# Patient Record
Sex: Female | Born: 1965 | Race: White | Hispanic: No | Marital: Married | Smoking: Never smoker
Health system: Southern US, Community
[De-identification: ages and names within clinical notes are randomized; demographics above are authoritative.]

## PROBLEM LIST (undated history)

## (undated) DIAGNOSIS — I10 Essential (primary) hypertension: Secondary | ICD-10-CM

## (undated) DIAGNOSIS — F329 Major depressive disorder, single episode, unspecified: Secondary | ICD-10-CM

## (undated) DIAGNOSIS — F32A Depression, unspecified: Secondary | ICD-10-CM

## (undated) DIAGNOSIS — T7840XA Allergy, unspecified, initial encounter: Secondary | ICD-10-CM

## (undated) DIAGNOSIS — I471 Supraventricular tachycardia, unspecified: Secondary | ICD-10-CM

## (undated) DIAGNOSIS — R011 Cardiac murmur, unspecified: Secondary | ICD-10-CM

## (undated) HISTORY — DX: Supraventricular tachycardia: I47.1

## (undated) HISTORY — DX: Essential (primary) hypertension: I10

## (undated) HISTORY — PX: GASTRECTOMY: SHX58

## (undated) HISTORY — DX: Supraventricular tachycardia, unspecified: I47.10

## (undated) HISTORY — DX: Allergy, unspecified, initial encounter: T78.40XA

## (undated) HISTORY — DX: Depression, unspecified: F32.A

## (undated) HISTORY — DX: Cardiac murmur, unspecified: R01.1

---
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
## 1998-07-24 ENCOUNTER — Ambulatory Visit (HOSPITAL_COMMUNITY): Admission: RE | Admit: 1998-07-24 | Discharge: 1998-07-24 | Payer: Self-pay | Admitting: *Deleted

## 1998-12-16 ENCOUNTER — Other Ambulatory Visit: Admission: RE | Admit: 1998-12-16 | Discharge: 1998-12-16 | Payer: Self-pay | Admitting: Obstetrics and Gynecology

## 1998-12-17 ENCOUNTER — Encounter: Payer: Self-pay | Admitting: Otolaryngology

## 1998-12-17 ENCOUNTER — Ambulatory Visit (HOSPITAL_COMMUNITY): Admission: RE | Admit: 1998-12-17 | Discharge: 1998-12-17 | Payer: Self-pay | Admitting: Otolaryngology

## 1999-10-03 ENCOUNTER — Encounter: Payer: Self-pay | Admitting: Obstetrics & Gynecology

## 1999-10-03 ENCOUNTER — Encounter: Admission: RE | Admit: 1999-10-03 | Discharge: 1999-10-03 | Payer: Self-pay | Admitting: Obstetrics & Gynecology

## 2000-03-03 ENCOUNTER — Inpatient Hospital Stay (HOSPITAL_COMMUNITY): Admission: AD | Admit: 2000-03-03 | Discharge: 2000-03-05 | Payer: Self-pay | Admitting: Obstetrics and Gynecology

## 2000-03-06 ENCOUNTER — Encounter: Admission: RE | Admit: 2000-03-06 | Discharge: 2000-06-04 | Payer: Self-pay | Admitting: Obstetrics and Gynecology

## 2000-04-07 ENCOUNTER — Other Ambulatory Visit: Admission: RE | Admit: 2000-04-07 | Discharge: 2000-04-07 | Payer: Self-pay | Admitting: Obstetrics and Gynecology

## 2000-06-04 ENCOUNTER — Encounter: Payer: Self-pay | Admitting: Infectious Diseases

## 2000-06-04 ENCOUNTER — Ambulatory Visit (HOSPITAL_COMMUNITY): Admission: RE | Admit: 2000-06-04 | Discharge: 2000-06-04 | Payer: Self-pay | Admitting: Family Medicine

## 2000-06-09 ENCOUNTER — Encounter: Admission: RE | Admit: 2000-06-09 | Discharge: 2000-09-07 | Payer: Self-pay | Admitting: Obstetrics and Gynecology

## 2001-01-26 ENCOUNTER — Encounter: Admission: RE | Admit: 2001-01-26 | Discharge: 2001-01-26 | Payer: Self-pay | Admitting: *Deleted

## 2001-04-12 ENCOUNTER — Other Ambulatory Visit: Admission: RE | Admit: 2001-04-12 | Discharge: 2001-04-12 | Payer: Self-pay | Admitting: Obstetrics and Gynecology

## 2002-01-26 ENCOUNTER — Encounter: Admission: RE | Admit: 2002-01-26 | Discharge: 2002-01-26 | Payer: Self-pay | Admitting: *Deleted

## 2002-05-09 ENCOUNTER — Other Ambulatory Visit: Admission: RE | Admit: 2002-05-09 | Discharge: 2002-05-09 | Payer: Self-pay | Admitting: Obstetrics and Gynecology

## 2002-07-31 ENCOUNTER — Ambulatory Visit (HOSPITAL_COMMUNITY): Admission: RE | Admit: 2002-07-31 | Discharge: 2002-07-31 | Payer: Self-pay | Admitting: Otolaryngology

## 2002-07-31 ENCOUNTER — Encounter: Payer: Self-pay | Admitting: Otolaryngology

## 2003-01-19 ENCOUNTER — Encounter: Admission: RE | Admit: 2003-01-19 | Discharge: 2003-01-19 | Payer: Self-pay | Admitting: *Deleted

## 2003-05-26 HISTORY — PX: NASAL SINUS SURGERY: SHX719

## 2003-06-13 ENCOUNTER — Other Ambulatory Visit: Admission: RE | Admit: 2003-06-13 | Discharge: 2003-06-13 | Payer: Self-pay | Admitting: Obstetrics and Gynecology

## 2003-07-15 ENCOUNTER — Emergency Department (HOSPITAL_COMMUNITY): Admission: EM | Admit: 2003-07-15 | Discharge: 2003-07-15 | Payer: Self-pay | Admitting: Emergency Medicine

## 2003-08-08 ENCOUNTER — Ambulatory Visit (HOSPITAL_COMMUNITY): Admission: RE | Admit: 2003-08-08 | Discharge: 2003-08-08 | Payer: Self-pay | Admitting: Neurology

## 2004-01-23 ENCOUNTER — Encounter: Admission: RE | Admit: 2004-01-23 | Discharge: 2004-01-23 | Payer: Self-pay | Admitting: General Surgery

## 2004-06-18 ENCOUNTER — Other Ambulatory Visit: Admission: RE | Admit: 2004-06-18 | Discharge: 2004-06-18 | Payer: Self-pay | Admitting: Obstetrics and Gynecology

## 2004-08-08 ENCOUNTER — Encounter: Admission: RE | Admit: 2004-08-08 | Discharge: 2004-08-08 | Payer: Self-pay | Admitting: Internal Medicine

## 2004-08-15 ENCOUNTER — Encounter: Admission: RE | Admit: 2004-08-15 | Discharge: 2004-08-15 | Payer: Self-pay | Admitting: Internal Medicine

## 2004-11-18 ENCOUNTER — Encounter: Admission: RE | Admit: 2004-11-18 | Discharge: 2004-11-18 | Payer: Self-pay | Admitting: Internal Medicine

## 2005-01-29 ENCOUNTER — Encounter: Admission: RE | Admit: 2005-01-29 | Discharge: 2005-01-29 | Payer: Self-pay | Admitting: Obstetrics and Gynecology

## 2005-02-05 ENCOUNTER — Encounter: Admission: RE | Admit: 2005-02-05 | Discharge: 2005-02-05 | Payer: Self-pay | Admitting: Obstetrics and Gynecology

## 2005-02-26 ENCOUNTER — Encounter: Admission: RE | Admit: 2005-02-26 | Discharge: 2005-02-26 | Payer: Self-pay | Admitting: Obstetrics and Gynecology

## 2005-05-22 ENCOUNTER — Encounter: Admission: RE | Admit: 2005-05-22 | Discharge: 2005-05-22 | Payer: Self-pay | Admitting: Internal Medicine

## 2005-06-23 ENCOUNTER — Other Ambulatory Visit: Admission: RE | Admit: 2005-06-23 | Discharge: 2005-06-23 | Payer: Self-pay | Admitting: Obstetrics and Gynecology

## 2006-01-12 ENCOUNTER — Encounter: Admission: RE | Admit: 2006-01-12 | Discharge: 2006-01-12 | Payer: Self-pay | Admitting: Obstetrics and Gynecology

## 2006-01-21 ENCOUNTER — Encounter: Admission: RE | Admit: 2006-01-21 | Discharge: 2006-01-21 | Payer: Self-pay | Admitting: Internal Medicine

## 2006-08-05 ENCOUNTER — Encounter: Admission: RE | Admit: 2006-08-05 | Discharge: 2006-08-05 | Payer: Self-pay | Admitting: Obstetrics and Gynecology

## 2007-01-19 ENCOUNTER — Encounter: Admission: RE | Admit: 2007-01-19 | Discharge: 2007-01-19 | Payer: Self-pay | Admitting: Obstetrics and Gynecology

## 2007-03-17 ENCOUNTER — Encounter: Admission: RE | Admit: 2007-03-17 | Discharge: 2007-03-17 | Payer: Self-pay | Admitting: General Surgery

## 2007-06-09 ENCOUNTER — Ambulatory Visit (HOSPITAL_BASED_OUTPATIENT_CLINIC_OR_DEPARTMENT_OTHER): Admission: RE | Admit: 2007-06-09 | Discharge: 2007-06-09 | Payer: Self-pay | Admitting: Orthopedic Surgery

## 2007-07-14 ENCOUNTER — Ambulatory Visit (HOSPITAL_BASED_OUTPATIENT_CLINIC_OR_DEPARTMENT_OTHER): Admission: RE | Admit: 2007-07-14 | Discharge: 2007-07-14 | Payer: Self-pay | Admitting: Orthopedic Surgery

## 2008-01-25 ENCOUNTER — Encounter: Admission: RE | Admit: 2008-01-25 | Discharge: 2008-01-25 | Payer: Self-pay | Admitting: General Surgery

## 2009-02-18 ENCOUNTER — Ambulatory Visit (HOSPITAL_COMMUNITY): Admission: RE | Admit: 2009-02-18 | Discharge: 2009-02-18 | Payer: Self-pay | Admitting: Obstetrics and Gynecology

## 2009-02-20 ENCOUNTER — Encounter: Admission: RE | Admit: 2009-02-20 | Discharge: 2009-02-20 | Payer: Self-pay | Admitting: General Surgery

## 2009-05-01 ENCOUNTER — Ambulatory Visit (HOSPITAL_COMMUNITY): Admission: RE | Admit: 2009-05-01 | Discharge: 2009-05-02 | Payer: Self-pay | Admitting: Obstetrics and Gynecology

## 2009-05-01 ENCOUNTER — Encounter (INDEPENDENT_AMBULATORY_CARE_PROVIDER_SITE_OTHER): Payer: Self-pay | Admitting: Obstetrics and Gynecology

## 2009-07-30 ENCOUNTER — Encounter: Admission: RE | Admit: 2009-07-30 | Discharge: 2009-09-26 | Payer: Self-pay | Admitting: Orthopedic Surgery

## 2010-02-27 ENCOUNTER — Encounter: Admission: RE | Admit: 2010-02-27 | Discharge: 2010-02-27 | Payer: Self-pay | Admitting: General Surgery

## 2010-04-24 HISTORY — PX: ABDOMINAL HYSTERECTOMY: SHX81

## 2010-05-08 ENCOUNTER — Ambulatory Visit: Payer: Self-pay | Admitting: Genetic Counselor

## 2010-08-26 LAB — CBC
MCHC: 33.2 g/dL (ref 30.0–36.0)
MCV: 92.6 fL (ref 78.0–100.0)
Platelets: 214 10*3/uL (ref 150–400)
Platelets: 263 10*3/uL (ref 150–400)
RDW: 13.2 % (ref 11.5–15.5)
WBC: 9.9 10*3/uL (ref 4.0–10.5)

## 2010-08-26 LAB — PREGNANCY, URINE: Preg Test, Ur: NEGATIVE

## 2010-10-07 NOTE — Op Note (Signed)
Rowe, Amber              ACCOUNT NO.:  [id number]   MEDICAL RECORD NO.:  [id number]          PATIENT TYPE:  AMB   LOCATION:  DSC                          FACILITY:  [facility]   PHYSICIAN:  Dionne Ano. Gramig III, M.D.DATE OF BIRTH:  05/27/1965   DATE OF PROCEDURE:  07/14/2007  DATE OF DISCHARGE:                               OPERATIVE REPORT   PREOPERATIVE DIAGNOSIS:  Left carpal tunnel syndrome.   POSTOPERATIVE DIAGNOSIS:  Left carpal tunnel syndrome.   PROCEDURE:  1. Left median nerve /peripheral nerve block at the wrist forearm      level for anesthetic purposes for carpal tunnel release.  2. Left limited open carpal tunnel release.   SURGEON:  Dionne Ano. Amanda Pea, M.D.   ASSISTANT:  Karie Chimera, P.A.-C.   COMPLICATIONS:  None.   ANESTHESIA:  Peripheral nerve block with IV sedation, keeping the  patient awake, alert, and oriented the entire case.   TOURNIQUET TIME:  Less than 10 minutes.   INDICATIONS FOR PROCEDURE:  This patient is a very pleasant female who  presents with the above-mentioned diagnosis.  She has had prior right  carpal tunnel release and did well from this.  She presents for left  carpal tunnel release.   OPERATION IN DETAIL:  The patient was seen by myself and anesthesia and  taken to the operative suite.  She underwent the induction of median  nerve/peripheral nerve block at the wrist forearm level for anesthetic  purposes for carpal tunnel release.  Following this, she was prepped and  draped in the usual sterile fashion with Betadine scrub and paint.  Once  this was done, the sterile field was secured, and the arm was elevated.  The tourniquet was insufflated to 250 mmHg.   A 1-cm incision was made through the transverse carpal ligament.  Dissection was carried down.  The palmar fascia was released.  Following  this, the distal edge was released under 4.0 loupe magnification,  carefully protecting the superficial palmar arch and the median  nerve.  Following this, distal and proximal dissection was carried out until I  was available __________  1, 2, and 3 which were placed just under the  proximal leading leaflet of the transverse carpal ligament.  Once this  was done, the security clip was placed just under proximal leading  leaflet, the obturator disengaged.  Following this, the security knife  was placed and a security clip effectively releasing the proximal  leaflet of the transverse carpal ligament.  The patient tolerated this  well.  There were no complicating features.  The carpal tunnel was then  inspected and was noted to be completely released without space-  occupying lesions or other problems noted.  I then irrigated copiously  with the tourniquet deflated, obtained hemostasis with bipolar  electrocautery, and I closed wound with interrupted Prolene.  She was  placed in a sterile dressing and taken to the recovery room.   All sponge and instrument counts were reported as correct.  She will be  discharged home on Vicodin and Robaxin p.r.n. which she already has for  pain and  spasm purposes.  She will return to see me in 7 days, therapy  in 12 days, and notify me should any problems occur.  This was an  uncomplicated carpal tunnel release, and she did very well with her  surgical intervention today.  It was a pleasure to see her, and we look  forward to participating in her care.           ______________________________  Dionne Ano. Everlene Other, M.D.     Nash Mantis  D:  07/14/2007  T:  07/15/2007  Job:  [id number]

## 2010-10-07 NOTE — Op Note (Signed)
Amber Rowe, Amber Rowe              ACCOUNT NO.:  [id number]   MEDICAL RECORD NO.:  [id number]          PATIENT TYPE:  AMB   LOCATION:  DSC                          FACILITY:  [facility]   PHYSICIAN:  Dionne Ano. Gramig III, M.D.DATE OF BIRTH:  1965/08/25   DATE OF PROCEDURE:  06/09/2007  DATE OF DISCHARGE:                               OPERATIVE REPORT   PREOPERATIVE DIAGNOSIS:  Bilateral carpal tunnel syndrome.   POSTOPERATIVE DIAGNOSIS:  Bilateral carpal tunnel syndrome.   PROCEDURE:  1. Right median nerve/peripheral nerve block.  2. Right carpal tunnel release.  3. Left carpal tunnel injection.   SURGEON:  Dionne Ano. Amanda Pea, M.D.   ASSISTANT:  Karie Chimera, P.A.-C.   COMPLICATIONS:  None.   ANESTHESIA:  Peripheral nerve block with IV sedation keeping the patient  awake, alert and oriented the entire case.   TOURNIQUET TIME:  Less than 15 minutes.   INDICATIONS FOR PROCEDURE:  Amber Rowe is a very pleasant female.  She  works as a Marine scientist at [employer] and has had long  standing carpal tunnel syndrome.  I have counseled her in regards to the  risks and benefits of surgery and should note she has failed  conservative management.  We have discussed the risks of bleeding,  infection, anesthesia, damage to normal structures, and failure of the  surgery to accomplish its intended goals of relieving symptoms and  restoring function. With this in mind, she desires to proceed.  All  questions have been encouraged answered preoperatively.   OPERATIVE PROCEDURE:  The patient was seen by myself and anesthesia and  taken to the operating suite and underwent 2 mg of Versed and 1 of  fentanyl and was given a median nerve/peripheral nerve block in the  normal fashion for carpal tunnel release.  Following this, the patient  then was prepped and draped in the usual sterile fashion with Betadine  scrub and paint.  Once this was complete, the arm was elevated and the  tourniquet insufflated to 250 mmHg and the patient then underwent a 1 cm  incision at the transverse carpal ligament.  Dissection was carried  down, the palmar fascia incised.  The distal edge of the transverse  carpal ligament was released under 4.0 magnification without difficulty.  The superficial palmar arch was protected at all.  The patient then had  distal to proximal dissection carried out was available preparatory  device 1, 2, and 3, which was placed just under the proximal leading  leaflet of the transverse carpal ligament.  Following this, the security  clip was placed, the obturator disengaged, and the security knife was  placed and the security clip effectively releasing the proximal leaflet  of the transverse carpal ligament.  I was pleased with the findings.  I  inspected the canal and noted that the nerve was hyperemic, flattened,  and intact.  She was awake, alert and oriented during all parts of the  procedure and tolerated the procedure beautifully.  I irrigated  copiously, obtained hemostasis with bipolar cautery, and closed the  wound with Prolene.  Following this, the  patient was dressed sterilely.  Once this was done, the left wrist was addressed with alcohol  preparation followed by sterile prep and placement of a carpal tunnel  injection utilizing 1 mL of Celestone and 1 mL of lidocaine. She  tolerated this well.  There were no complicating features.  Once this  was done, the patient then underwent placement of a Band-Aid and was  taken to the recovery room.  She will be monitored in the recovery room  and discharged home once appropriate. I have discussed with her the  do's, the don't's, etc., and all questions have been encouraged and  answered.  It was an absolute pleasure to see her today and we look  forward to participating in her postoperative care.           ______________________________  Dionne Ano. Everlene Other, M.D.     Nash Mantis  D:  06/09/2007  T:   06/09/2007  Job:  [id number]

## 2011-01-19 ENCOUNTER — Other Ambulatory Visit (INDEPENDENT_AMBULATORY_CARE_PROVIDER_SITE_OTHER): Payer: Self-pay | Admitting: General Surgery

## 2011-01-19 DIAGNOSIS — Z1231 Encounter for screening mammogram for malignant neoplasm of breast: Secondary | ICD-10-CM

## 2011-01-30 ENCOUNTER — Other Ambulatory Visit (INDEPENDENT_AMBULATORY_CARE_PROVIDER_SITE_OTHER): Payer: Self-pay | Admitting: General Surgery

## 2011-01-30 DIAGNOSIS — N6012 Diffuse cystic mastopathy of left breast: Secondary | ICD-10-CM

## 2011-02-12 LAB — HCG, SERUM, QUALITATIVE: Preg, Serum: NEGATIVE

## 2011-03-03 ENCOUNTER — Other Ambulatory Visit (INDEPENDENT_AMBULATORY_CARE_PROVIDER_SITE_OTHER): Payer: Self-pay | Admitting: General Surgery

## 2011-03-03 ENCOUNTER — Ambulatory Visit
Admission: RE | Admit: 2011-03-03 | Discharge: 2011-03-03 | Disposition: A | Discharge status: Home / Self Care | Payer: 59 | Source: Ambulatory Visit | Attending: General Surgery | Admitting: General Surgery

## 2011-03-03 DIAGNOSIS — Z1231 Encounter for screening mammogram for malignant neoplasm of breast: Secondary | ICD-10-CM

## 2011-03-03 DIAGNOSIS — N644 Mastodynia: Secondary | ICD-10-CM

## 2011-03-20 ENCOUNTER — Other Ambulatory Visit (INDEPENDENT_AMBULATORY_CARE_PROVIDER_SITE_OTHER): Payer: Self-pay | Admitting: General Surgery

## 2011-03-20 ENCOUNTER — Ambulatory Visit
Admission: RE | Admit: 2011-03-20 | Discharge: 2011-03-20 | Disposition: A | Discharge status: Home / Self Care | Payer: 59 | Source: Ambulatory Visit | Attending: General Surgery | Admitting: General Surgery

## 2011-03-20 DIAGNOSIS — N644 Mastodynia: Secondary | ICD-10-CM

## 2011-03-31 ENCOUNTER — Other Ambulatory Visit: Payer: 59

## 2011-04-14 ENCOUNTER — Telehealth (INDEPENDENT_AMBULATORY_CARE_PROVIDER_SITE_OTHER): Payer: Self-pay

## 2011-04-14 NOTE — Telephone Encounter (Signed)
Called Ms. Tholl, no answer left voice message to return my call.  I will be out of the office and return on 04/20/11.

## 2011-04-21 ENCOUNTER — Telehealth (INDEPENDENT_AMBULATORY_CARE_PROVIDER_SITE_OTHER): Payer: Self-pay

## 2011-04-21 NOTE — Telephone Encounter (Signed)
Patient was previous scheduled for an MRI, insurance denied prior approval.  Ms. Oakland called to give [facility] Radiologist MD phone number to call, they are requesting more information to support the need for an MRI.  [phone] prompt [phone])  I told Ms. Duris I will call [employer] to find out the additional supporting documents they need for prior approval.  I will call patient once I have obtained a final answer on approval.

## 2011-05-06 ENCOUNTER — Other Ambulatory Visit (INDEPENDENT_AMBULATORY_CARE_PROVIDER_SITE_OTHER): Payer: Self-pay

## 2011-05-06 ENCOUNTER — Telehealth (INDEPENDENT_AMBULATORY_CARE_PROVIDER_SITE_OTHER): Payer: Self-pay

## 2011-05-06 NOTE — Telephone Encounter (Signed)
Left message for Amber Rowe to call our office regarding her MRI ---- [employer] approved through 06/19/2011  CPT code 40981   Authorization approval code [id number].

## 2011-05-22 ENCOUNTER — Ambulatory Visit
Admission: RE | Admit: 2011-05-22 | Discharge: 2011-05-22 | Disposition: A | Discharge status: Home / Self Care | Payer: 59 | Source: Ambulatory Visit | Attending: General Surgery | Admitting: General Surgery

## 2011-05-22 DIAGNOSIS — N6012 Diffuse cystic mastopathy of left breast: Secondary | ICD-10-CM

## 2011-05-22 MED ORDER — GADOBENATE DIMEGLUMINE 529 MG/ML IV SOLN
15.0000 mL | Freq: Once | INTRAVENOUS | Status: AC | PRN
  Administered 2011-05-22: 15 mL via INTRAVENOUS

## 2011-06-16 ENCOUNTER — Telehealth (INDEPENDENT_AMBULATORY_CARE_PROVIDER_SITE_OTHER): Payer: Self-pay

## 2011-06-16 NOTE — Telephone Encounter (Signed)
Returned patient phone call regarding her MRI results, I will review with Dr. Johna Sheriff and schedule follow up appt accordingly.  Left message stating I will call on 06/17/11 w/results and appointment information.

## 2011-06-18 ENCOUNTER — Telehealth (INDEPENDENT_AMBULATORY_CARE_PROVIDER_SITE_OTHER): Payer: Self-pay

## 2011-06-18 NOTE — Telephone Encounter (Signed)
Called MRI results to patient, Amber Rowe has a follow up appointment on 07/15/11 w/Dr. Johna Sheriff.

## 2011-07-15 ENCOUNTER — Ambulatory Visit (INDEPENDENT_AMBULATORY_CARE_PROVIDER_SITE_OTHER): Payer: 59 | Admitting: General Surgery

## 2011-07-15 ENCOUNTER — Encounter (INDEPENDENT_AMBULATORY_CARE_PROVIDER_SITE_OTHER): Payer: Self-pay | Admitting: General Surgery

## 2011-07-15 DIAGNOSIS — Z803 Family history of malignant neoplasm of breast: Secondary | ICD-10-CM | POA: Insufficient documentation

## 2011-07-15 DIAGNOSIS — N6019 Diffuse cystic mastopathy of unspecified breast: Secondary | ICD-10-CM

## 2011-07-15 NOTE — Progress Notes (Signed)
Chief complaint: Followup fibrocystic disease and family history of breast cancer.  History: Patient returns for yearly followup for breast disease with fibrocystic breast changes and strong family history of breast cancer in her mother, sister, and aunt. We had referred her to genetics at her last visit but her mother has tested negative and therefore genetic testing was not recommended. She has not noted any specific changes in her breast exam. She recently completed a mammography and a semiannual MRI.  Exam: Gen.: Appears well Skin: No rash or infection Lymph nodes: No palpable cervical, supraclavicular, or axillary nodes. Breasts: No skin or nipple changes. I cannot feel any abnormalities in the right breast. In the left breast in the upper outer quadrant is a stable approximately 1-1/2-2 cm area of thickening which is unchanged from previous exams.  Mammogram showed a benign appearing density in the lateral right breast confirmed to be a cluster of cysts. MRI showed background density but no suspicious abnormalities.  Assessment and plan: Personal history of fibrocystic disease and strong family history of breast cancer. Her imaging and exam is stable without new areas of concern. She will return in one year.

## 2011-08-07 ENCOUNTER — Ambulatory Visit (INDEPENDENT_AMBULATORY_CARE_PROVIDER_SITE_OTHER): Payer: 59 | Admitting: General Surgery

## 2012-01-19 ENCOUNTER — Other Ambulatory Visit (INDEPENDENT_AMBULATORY_CARE_PROVIDER_SITE_OTHER): Payer: Self-pay | Admitting: General Surgery

## 2012-01-19 DIAGNOSIS — Z1231 Encounter for screening mammogram for malignant neoplasm of breast: Secondary | ICD-10-CM

## 2012-01-19 DIAGNOSIS — Z803 Family history of malignant neoplasm of breast: Secondary | ICD-10-CM

## 2012-03-22 ENCOUNTER — Ambulatory Visit: Payer: 59

## 2012-04-04 ENCOUNTER — Ambulatory Visit
Admission: RE | Admit: 2012-04-04 | Discharge: 2012-04-04 | Disposition: A | Discharge status: Home / Self Care | Payer: 59 | Source: Ambulatory Visit | Attending: General Surgery | Admitting: General Surgery

## 2012-04-04 DIAGNOSIS — Z803 Family history of malignant neoplasm of breast: Secondary | ICD-10-CM

## 2012-04-04 DIAGNOSIS — Z1231 Encounter for screening mammogram for malignant neoplasm of breast: Secondary | ICD-10-CM

## 2012-04-07 ENCOUNTER — Other Ambulatory Visit (INDEPENDENT_AMBULATORY_CARE_PROVIDER_SITE_OTHER): Payer: Self-pay | Admitting: General Surgery

## 2012-04-07 DIAGNOSIS — R928 Other abnormal and inconclusive findings on diagnostic imaging of breast: Secondary | ICD-10-CM

## 2012-04-12 ENCOUNTER — Ambulatory Visit
Admission: RE | Admit: 2012-04-12 | Discharge: 2012-04-12 | Disposition: A | Discharge status: Home / Self Care | Payer: 59 | Source: Ambulatory Visit | Attending: General Surgery | Admitting: General Surgery

## 2012-04-12 DIAGNOSIS — R928 Other abnormal and inconclusive findings on diagnostic imaging of breast: Secondary | ICD-10-CM

## 2012-04-14 ENCOUNTER — Other Ambulatory Visit: Payer: 59

## 2012-04-15 ENCOUNTER — Other Ambulatory Visit: Payer: 59

## 2012-04-15 ENCOUNTER — Telehealth (INDEPENDENT_AMBULATORY_CARE_PROVIDER_SITE_OTHER): Payer: Self-pay

## 2012-04-15 NOTE — Telephone Encounter (Signed)
Diagnostic Study form signed by Dr. Johna Sheriff & faxed to [facility] [phone].

## 2012-08-11 ENCOUNTER — Ambulatory Visit (INDEPENDENT_AMBULATORY_CARE_PROVIDER_SITE_OTHER): Payer: 59 | Admitting: General Surgery

## 2012-08-11 ENCOUNTER — Encounter (INDEPENDENT_AMBULATORY_CARE_PROVIDER_SITE_OTHER): Payer: Self-pay | Admitting: General Surgery

## 2012-08-11 VITALS — BP 128/84 | HR 84 | Temp 98.6°F | Resp 18 | Ht 66.5 in | Wt 195.0 lb

## 2012-08-11 DIAGNOSIS — N6019 Diffuse cystic mastopathy of unspecified breast: Secondary | ICD-10-CM

## 2012-08-11 DIAGNOSIS — Z803 Family history of malignant neoplasm of breast: Secondary | ICD-10-CM

## 2012-08-11 NOTE — Progress Notes (Signed)
Chief complaint: Followup fibrocystic disease and family history of breast cancer.   History: Patient returns for yearly followup for breast disease with fibrocystic breast changes and strong family history of breast cancer in her mother, sister, and aunt. We had previously referred her to genetics  but her mother has tested negative and therefore genetic testing was not recommended. She has not noted any specific changes in her breast exam. She had her mammogram done in November. This suggested a mass in the upper-outer quadrant of the left breast which has been noted previously. Ultrasound showed a small cluster of simple cysts in this area which is a stable finding from previous exams.  Exam:   Gen.: Appears well  Skin: No rash or infection  Lymph nodes: No palpable cervical, supraclavicular, or axillary nodes.  Breasts: No skin or nipple changes. I cannot feel any abnormalities in the right breast. In the left breast in the upper outer quadrant is a stable approximately 1-1/2-2 cm area of thickening which is unchanged from previous exams.   Assessment and plan: Personal history of fibrocystic disease and strong family history of breast cancer. Her imaging and exam is stable without new areas of concern. She will return in one year.

## 2012-08-11 NOTE — Addendum Note (Signed)
Addended by: Maryan Puls on: 08/11/2012 05:35 PM   Modules accepted: Orders

## 2012-09-14 ENCOUNTER — Encounter: Payer: Self-pay | Admitting: Cardiovascular Disease

## 2012-11-25 ENCOUNTER — Emergency Department (HOSPITAL_COMMUNITY)
Admission: EM | Admit: 2012-11-25 | Discharge: 2012-11-25 | Disposition: A | Discharge status: Home / Self Care | Payer: 59 | Source: Home / Self Care | Attending: Family Medicine | Admitting: Family Medicine

## 2012-11-25 ENCOUNTER — Encounter (HOSPITAL_COMMUNITY): Payer: Self-pay | Admitting: Emergency Medicine

## 2012-11-25 ENCOUNTER — Emergency Department (INDEPENDENT_AMBULATORY_CARE_PROVIDER_SITE_OTHER): Payer: 59

## 2012-11-25 DIAGNOSIS — K2970 Gastritis, unspecified, without bleeding: Secondary | ICD-10-CM

## 2012-11-25 DIAGNOSIS — K2990 Gastroduodenitis, unspecified, without bleeding: Secondary | ICD-10-CM

## 2012-11-25 LAB — COMPREHENSIVE METABOLIC PANEL
BUN: 12 mg/dL (ref 6–23)
CO2: 26 mEq/L (ref 19–32)
Chloride: 103 mEq/L (ref 96–112)
Creatinine, Ser: 0.8 mg/dL (ref 0.50–1.10)
GFR calc Af Amer: >90 mL/min (ref >90)
GFR calc non Af Amer: 88 mL/min — ABNORMAL LOW (ref >90)
Total Bilirubin: 0.4 mg/dL (ref 0.3–1.2)

## 2012-11-25 MED ORDER — ACETAMINOPHEN-CODEINE 120-12 MG/5ML PO SOLN: 5.0000 mL | Freq: Four times a day (QID) | ORAL | Status: DC | PRN

## 2012-11-25 MED ORDER — ONDANSETRON 4 MG PO TBDP: 4.0000 mg | ORAL_TABLET | Freq: Two times a day (BID) | ORAL | Status: DC | PRN

## 2012-11-25 MED ORDER — RANITIDINE HCL 150 MG PO CAPS: 150.0000 mg | ORAL_CAPSULE | Freq: Every day | ORAL | Status: DC

## 2012-11-25 MED ORDER — OMEPRAZOLE 20 MG PO CPDR: DELAYED_RELEASE_CAPSULE | ORAL | Status: DC

## 2012-11-25 MED ORDER — SUCRALFATE 1 GM/10ML PO SUSP: 1.0000 g | Freq: Three times a day (TID) | ORAL | Status: DC

## 2012-11-25 MED ORDER — GI COCKTAIL ~~LOC~~
30.0000 mL | Freq: Once | ORAL | Status: AC
  Administered 2012-11-25: 30 mL via ORAL

## 2012-11-25 MED ORDER — GI COCKTAIL ~~LOC~~
ORAL | Status: AC
  Filled 2012-11-25: qty 30

## 2012-11-25 NOTE — ED Notes (Signed)
Chart review.

## 2012-11-25 NOTE — ED Provider Notes (Signed)
History    CSN: [id number] Arrival date & time 11/25/12  1007  First MD Initiated Contact with Patient 11/25/12 1120     Chief Complaint  Patient presents with  . Chest Pain   (Consider location/radiation/quality/duration/timing/severity/associated sxs/prior Treatment) HPI Comments: 47 year old female here complaining of epigastric pain with radiation upward to the center and left side of her chest intermittently for the last 4 days. Patient stated she had a high school reunion where she consumed alcohol above what is usual for her before her symptoms started. Patient denies being a heavy drinker or drinking alcohol regularly. States she drinks alcohol only occasional. She reports burning sensation from the epigastric area upward but denies acid taste in her mouth. Reports frequent belching and feeling distended and a notch like sensation in her epigastric area.. State her pain is worse with taking deep breath. Denies nausea or vomiting. Pain is also triggered short time after eating. Denies diaphoresis. Denies shortness of breath or cough. States she had few episodes of loose stools the beginning of the week but is having normal bowel movements in last 2 days with no melena. No jaundice.   Past Medical History  Diagnosis Date  . Asthma   . Heart murmur    Past Surgical History  Procedure Laterality Date  . Nasal sinus surgery  2005  . Abdominal hysterectomy  04/2010   Family History  Problem Relation Age of Onset  . Cancer Mother     breast  . Cancer Sister     ovarian  . Cancer Maternal Grandmother     skin   History  Substance Use Topics  . Smoking status: Never Smoker   . Smokeless tobacco: Not on file  . Alcohol Use: Yes   OB History   Grav Para Term Preterm Abortions TAB SAB Ect Mult Living                 Review of Systems  Constitutional: Negative for fever, chills, diaphoresis and fatigue.  HENT: Negative for congestion and sore throat.   Respiratory: Negative  for cough and shortness of breath.   Cardiovascular: Positive for chest pain. Negative for palpitations and leg swelling.  Gastrointestinal: Positive for abdominal pain, diarrhea and abdominal distention. Negative for vomiting, constipation and blood in stool.  Genitourinary: Negative for dysuria, vaginal bleeding, vaginal discharge and pelvic pain.  Musculoskeletal: Negative for myalgias and arthralgias.  Skin: Negative for rash.  Neurological: Negative for dizziness and headaches.    Allergies  Latex  Home Medications   Current Outpatient Rx  Name  Route  Sig  Dispense  Refill  . acetaminophen-codeine 120-12 MG/5ML solution   Oral   Take 5 mLs by mouth every 6 (six) hours as needed for pain.   120 mL   0   . albuterol (PROVENTIL HFA;VENTOLIN HFA) 108 (90 BASE) MCG/ACT inhaler   Inhalation   Inhale 2 puffs into the lungs every 6 (six) hours as needed for wheezing.         Marland Kitchen buPROPion (WELLBUTRIN XL) 150 MG 24 hr tablet      daily.         . busPIRone (BUSPAR) 5 MG tablet      daily.         Marland Kitchen CALCIUM PO   Oral   Take 1,000 Units by mouth daily.         . citalopram (CELEXA) 10 MG tablet   Oral   Take 10 mg by mouth daily.         Marland Kitchen  hydrocortisone 2.5 % cream      Ad lib.         Marland Kitchen lisinopril (PRINIVIL,ZESTRIL) 10 MG tablet               . Multiple Vitamin (MULTIVITAMIN) capsule   Oral   Take 1 capsule by mouth daily.         Marland Kitchen omeprazole (PRILOSEC) 20 MG capsule      1 tab by mouth daily when necessary   30 capsule   0   . ondansetron (ZOFRAN-ODT) 4 MG disintegrating tablet   Oral   Take 1 tablet (4 mg total) by mouth every 12 (twelve) hours as needed for nausea.   10 tablet   0   . QVAR 40 MCG/ACT inhaler      daily.         . ranitidine (ZANTAC) 150 MG capsule   Oral   Take 1 capsule (150 mg total) by mouth daily.   30 capsule   0   . sucralfate (CARAFATE) 1 GM/10ML suspension   Oral   Take 10 mLs (1 g total) by mouth  4 (four) times daily -  with meals and at bedtime.   420 mL   0   . VITAMIN E PO   Oral   Take 1,000 Units by mouth.          BP 120/83  Pulse 89  Temp(Src) 98.1 F (36.7 C) (Oral)  Resp 16  SpO2 100% Physical Exam  Nursing note and vitals reviewed. Constitutional: She is oriented to person, place, and time. She appears well-developed and well-nourished. No distress.  Cooperative appears comfortable.  HENT:  Head: Normocephalic and atraumatic.  Mouth/Throat: Oropharynx is clear and moist. No oropharyngeal exudate.  Eyes: Conjunctivae are normal. No scleral icterus.  Neck: Neck supple. No JVD present. No thyromegaly present.  Cardiovascular: Normal rate, regular rhythm, normal heart sounds and intact distal pulses.  Exam reveals no gallop and no friction rub.   No murmur heard. Pulmonary/Chest: Effort normal and breath sounds normal. No respiratory distress. She has no wheezes. She has no rales. She exhibits no tenderness.  Abdominal: Soft. Bowel sounds are normal. She exhibits no distension and no mass. There is no rebound and no guarding.  Tenderness to deep palpation in epigastric area. No Murphy sign.  Lymphadenopathy:    She has no cervical adenopathy.  Neurological: She is alert and oriented to person, place, and time.  Skin: No rash noted. She is not diaphoretic.    ED Course  Procedures (including critical care time) Labs Reviewed  COMPREHENSIVE METABOLIC PANEL - Abnormal; Notable for the following:    GFR calc non Af Amer 88 (*)    All other components within normal limits  LIPASE, BLOOD   Dg Chest 2 View  11/25/2012   *RADIOLOGY REPORT*  Clinical Data: Left-sided chest pain.  CHEST - 2 VIEW  Comparison: None.  Findings: Heart size and pulmonary vascularity are normal and the lungs are clear except for minimal areas of linear atelectasis or scarring at the left base.  No osseous abnormality. No pneumothorax.  IMPRESSION: Minimal atelectasis or scarring at the  left base laterally.   Original Report Authenticated By: Francene Boyers, M.D.   1. Gastritis     EKG normal sinus rhythm with a ventricular rate at 71 beats per minutes and no acute ischemic changes.  MDM  Impress gastritis. Symptoms improved with GI cocktail administration here.  Lipase and Cmet results were  pending at time of discharge. (Reviewed later with normal results). Prescribed Carafate, Zantac, ondansetron and omeprazole. Supportive care and red flags that should prompt her return to medical attention discussed with patient and provided in writing. Specifically patient was asked to go to the emergency department if new or worsening symptoms despite following treatment. Also contact information for a GI referral provided followup as needed.  Sharin Grave, MD 11/26/12 1600

## 2012-11-25 NOTE — ED Notes (Signed)
Patient reports pain across epigastric area, pain from above left breast to below left breast.  Patient reports belching frequently.  No nausea, no vomiting, no sob.  Patient sharp pain with inspiration.

## 2012-11-25 NOTE — ED Notes (Signed)
Able to take a deep breath

## 2012-11-25 NOTE — ED Notes (Signed)
guiac card and result solution at bedside

## 2013-03-27 ENCOUNTER — Other Ambulatory Visit: Payer: Self-pay

## 2013-03-27 DIAGNOSIS — Z803 Family history of malignant neoplasm of breast: Secondary | ICD-10-CM

## 2013-03-27 DIAGNOSIS — Z1231 Encounter for screening mammogram for malignant neoplasm of breast: Secondary | ICD-10-CM

## 2013-03-30 ENCOUNTER — Other Ambulatory Visit: Payer: Self-pay

## 2013-04-25 ENCOUNTER — Ambulatory Visit
Admission: RE | Admit: 2013-04-25 | Discharge: 2013-04-25 | Disposition: A | Discharge status: Home / Self Care | Payer: 59 | Source: Ambulatory Visit

## 2013-04-25 DIAGNOSIS — Z1231 Encounter for screening mammogram for malignant neoplasm of breast: Secondary | ICD-10-CM

## 2013-04-25 DIAGNOSIS — Z803 Family history of malignant neoplasm of breast: Secondary | ICD-10-CM

## 2013-04-26 ENCOUNTER — Other Ambulatory Visit (INDEPENDENT_AMBULATORY_CARE_PROVIDER_SITE_OTHER): Payer: Self-pay | Admitting: General Surgery

## 2013-04-26 DIAGNOSIS — R928 Other abnormal and inconclusive findings on diagnostic imaging of breast: Secondary | ICD-10-CM

## 2013-05-02 ENCOUNTER — Ambulatory Visit
Admission: RE | Admit: 2013-05-02 | Discharge: 2013-05-02 | Disposition: A | Discharge status: Home / Self Care | Payer: 59 | Source: Ambulatory Visit | Attending: General Surgery | Admitting: General Surgery

## 2013-05-02 DIAGNOSIS — R928 Other abnormal and inconclusive findings on diagnostic imaging of breast: Secondary | ICD-10-CM

## 2013-05-10 ENCOUNTER — Other Ambulatory Visit: Payer: Self-pay | Admitting: Obstetrics and Gynecology

## 2013-06-05 ENCOUNTER — Other Ambulatory Visit: Payer: 59

## 2013-06-06 ENCOUNTER — Ambulatory Visit
Admission: RE | Admit: 2013-06-06 | Discharge: 2013-06-06 | Disposition: A | Discharge status: Home / Self Care | Payer: 59 | Source: Ambulatory Visit | Attending: General Surgery | Admitting: General Surgery

## 2013-06-06 DIAGNOSIS — N6019 Diffuse cystic mastopathy of unspecified breast: Secondary | ICD-10-CM

## 2013-06-06 DIAGNOSIS — Z803 Family history of malignant neoplasm of breast: Secondary | ICD-10-CM

## 2013-06-06 MED ORDER — GADOBENATE DIMEGLUMINE 529 MG/ML IV SOLN
18.0000 mL | Freq: Once | INTRAVENOUS | Status: AC | PRN
  Administered 2013-06-06: 18 mL via INTRAVENOUS

## 2013-06-08 ENCOUNTER — Other Ambulatory Visit: Payer: 59

## 2013-09-15 ENCOUNTER — Ambulatory Visit (INDEPENDENT_AMBULATORY_CARE_PROVIDER_SITE_OTHER): Payer: 59 | Admitting: General Surgery

## 2013-09-15 ENCOUNTER — Encounter (INDEPENDENT_AMBULATORY_CARE_PROVIDER_SITE_OTHER): Payer: Self-pay | Admitting: General Surgery

## 2013-09-15 VITALS — BP 122/82 | HR 78 | Temp 97.6°F | Ht 67.0 in | Wt 195.0 lb

## 2013-09-15 DIAGNOSIS — N6019 Diffuse cystic mastopathy of unspecified breast: Secondary | ICD-10-CM

## 2013-09-15 DIAGNOSIS — Z803 Family history of malignant neoplasm of breast: Secondary | ICD-10-CM

## 2013-09-15 NOTE — Progress Notes (Signed)
Chief complaint: Followup fibrocystic disease and family history of breast cancer.   History: Patient returns for yearly followup for breast disease with fibrocystic breast changes and strong family history of breast cancer in her mother, sister, and aunt. We had previously referred her to genetics  but her mother has tested negative and therefore genetic testing was not recommended. She has not noted any specific changes in her breast exam. She had her mammogram done inDecember. This was negative although she has very dense breast tissue.. Screening MRI was done in January 2 2 strong family history and dense breast tissue. This was negative for any evidence of malignancy or lymphadenopathy. She still notices a little area of swelling in the upper outer left breast as previously.  Exam: BP 122/82  Pulse 78  Temp(Src) 97.6 F (36.4 C)  Ht 5\' 7"  (1.702 m)  Wt 195 lb (88.451 kg)  BMI 30.53 kg/m2  Gen.: Appears well  Skin: No rash or infection  Lymph nodes: No palpable cervical, supraclavicular, or axillary nodes.  Breasts: No skin or nipple changes. I cannot feel any abnormalities in the right breast. In the left breast in the upper outer quadrant is a stable approximately 1-1/2-2 cm area of thickening which is unchanged from previous exams.   Assessment and plan: Personal history of fibrocystic disease and strong family history of breast cancer. Her imaging and exam is stable without new areas of concern. She will return in one year.

## 2013-11-07 DIAGNOSIS — C569 Malignant neoplasm of unspecified ovary: Secondary | ICD-10-CM | POA: Insufficient documentation

## 2013-11-07 DIAGNOSIS — C50919 Malignant neoplasm of unspecified site of unspecified female breast: Secondary | ICD-10-CM | POA: Insufficient documentation

## 2013-11-07 DIAGNOSIS — F419 Anxiety disorder, unspecified: Secondary | ICD-10-CM | POA: Insufficient documentation

## 2013-11-07 DIAGNOSIS — I10 Essential (primary) hypertension: Secondary | ICD-10-CM | POA: Insufficient documentation

## 2013-11-07 DIAGNOSIS — J45909 Unspecified asthma, uncomplicated: Secondary | ICD-10-CM | POA: Insufficient documentation

## 2013-11-23 ENCOUNTER — Other Ambulatory Visit: Payer: Self-pay | Admitting: Internal Medicine

## 2014-06-25 ENCOUNTER — Other Ambulatory Visit: Payer: Self-pay | Admitting: Dermatology

## 2014-07-16 ENCOUNTER — Other Ambulatory Visit: Payer: Self-pay

## 2014-07-16 DIAGNOSIS — Z1231 Encounter for screening mammogram for malignant neoplasm of breast: Secondary | ICD-10-CM

## 2014-07-18 ENCOUNTER — Other Ambulatory Visit (INDEPENDENT_AMBULATORY_CARE_PROVIDER_SITE_OTHER): Payer: Self-pay

## 2014-07-18 ENCOUNTER — Other Ambulatory Visit (INDEPENDENT_AMBULATORY_CARE_PROVIDER_SITE_OTHER): Payer: Self-pay | Admitting: *Deleted

## 2014-07-18 DIAGNOSIS — Z803 Family history of malignant neoplasm of breast: Secondary | ICD-10-CM

## 2014-07-18 DIAGNOSIS — N6019 Diffuse cystic mastopathy of unspecified breast: Secondary | ICD-10-CM

## 2014-07-18 DIAGNOSIS — R922 Inconclusive mammogram: Secondary | ICD-10-CM

## 2014-07-20 ENCOUNTER — Ambulatory Visit
Admission: RE | Admit: 2014-07-20 | Discharge: 2014-07-20 | Disposition: A | Discharge status: Home / Self Care | Payer: 59 | Source: Ambulatory Visit

## 2014-07-20 DIAGNOSIS — Z1231 Encounter for screening mammogram for malignant neoplasm of breast: Secondary | ICD-10-CM

## 2014-07-23 ENCOUNTER — Other Ambulatory Visit: Payer: Self-pay | Admitting: Obstetrics and Gynecology

## 2014-07-23 DIAGNOSIS — R928 Other abnormal and inconclusive findings on diagnostic imaging of breast: Secondary | ICD-10-CM

## 2014-07-30 ENCOUNTER — Inpatient Hospital Stay: Admission: RE | Admit: 2014-07-30 | Payer: Self-pay | Source: Ambulatory Visit

## 2014-07-31 ENCOUNTER — Ambulatory Visit
Admission: RE | Admit: 2014-07-31 | Discharge: 2014-07-31 | Disposition: A | Discharge status: Home / Self Care | Payer: 59 | Source: Ambulatory Visit | Attending: Obstetrics and Gynecology | Admitting: Obstetrics and Gynecology

## 2014-07-31 DIAGNOSIS — R928 Other abnormal and inconclusive findings on diagnostic imaging of breast: Secondary | ICD-10-CM

## 2014-08-03 ENCOUNTER — Ambulatory Visit
Admission: RE | Admit: 2014-08-03 | Discharge: 2014-08-03 | Disposition: A | Discharge status: Home / Self Care | Payer: 59 | Source: Ambulatory Visit | Attending: General Surgery | Admitting: General Surgery

## 2014-08-03 MED ORDER — GADOBENATE DIMEGLUMINE 529 MG/ML IV SOLN
19.0000 mL | Freq: Once | INTRAVENOUS | Status: AC | PRN
  Administered 2014-08-03: 19 mL via INTRAVENOUS

## 2015-05-29 ENCOUNTER — Other Ambulatory Visit: Payer: Self-pay | Admitting: Obstetrics and Gynecology

## 2015-05-30 LAB — CYTOLOGY - PAP

## 2015-08-09 ENCOUNTER — Other Ambulatory Visit: Payer: Self-pay

## 2015-08-09 DIAGNOSIS — Z1231 Encounter for screening mammogram for malignant neoplasm of breast: Secondary | ICD-10-CM

## 2015-08-26 ENCOUNTER — Ambulatory Visit
Admission: RE | Admit: 2015-08-26 | Discharge: 2015-08-26 | Disposition: A | Discharge status: Home / Self Care | Payer: 59 | Source: Ambulatory Visit

## 2015-08-26 DIAGNOSIS — Z1231 Encounter for screening mammogram for malignant neoplasm of breast: Secondary | ICD-10-CM

## 2016-04-02 ENCOUNTER — Other Ambulatory Visit: Payer: Self-pay | Admitting: Internal Medicine

## 2016-04-02 DIAGNOSIS — R590 Localized enlarged lymph nodes: Secondary | ICD-10-CM

## 2016-04-08 ENCOUNTER — Ambulatory Visit
Admission: RE | Admit: 2016-04-08 | Discharge: 2016-04-08 | Disposition: A | Discharge status: Home / Self Care | Payer: 59 | Source: Ambulatory Visit | Attending: Internal Medicine | Admitting: Internal Medicine

## 2016-04-08 DIAGNOSIS — R590 Localized enlarged lymph nodes: Secondary | ICD-10-CM

## 2016-04-08 MED ORDER — IOPAMIDOL (ISOVUE-300) INJECTION 61%
100.0000 mL | Freq: Once | INTRAVENOUS | Status: AC | PRN
  Administered 2016-04-08: 100 mL via INTRAVENOUS

## 2016-07-24 ENCOUNTER — Other Ambulatory Visit: Payer: Self-pay | Admitting: Obstetrics and Gynecology

## 2016-07-24 DIAGNOSIS — Z1231 Encounter for screening mammogram for malignant neoplasm of breast: Secondary | ICD-10-CM

## 2016-08-31 ENCOUNTER — Ambulatory Visit
Admission: RE | Admit: 2016-08-31 | Discharge: 2016-08-31 | Disposition: A | Discharge status: Home / Self Care | Payer: 59 | Source: Ambulatory Visit | Attending: Obstetrics and Gynecology | Admitting: Obstetrics and Gynecology

## 2016-08-31 DIAGNOSIS — Z1231 Encounter for screening mammogram for malignant neoplasm of breast: Secondary | ICD-10-CM

## 2016-12-30 ENCOUNTER — Other Ambulatory Visit: Payer: Self-pay | Admitting: General Surgery

## 2016-12-30 DIAGNOSIS — N6019 Diffuse cystic mastopathy of unspecified breast: Secondary | ICD-10-CM

## 2017-01-28 ENCOUNTER — Other Ambulatory Visit: Payer: 59

## 2017-01-28 ENCOUNTER — Ambulatory Visit
Admission: RE | Admit: 2017-01-28 | Discharge: 2017-01-28 | Disposition: A | Discharge status: Home / Self Care | Payer: No Typology Code available for payment source | Source: Ambulatory Visit | Attending: General Surgery | Admitting: General Surgery

## 2017-01-28 DIAGNOSIS — N6019 Diffuse cystic mastopathy of unspecified breast: Secondary | ICD-10-CM

## 2017-01-28 MED ORDER — GADOBENATE DIMEGLUMINE 529 MG/ML IV SOLN
20.0000 mL | Freq: Once | INTRAVENOUS | Status: AC | PRN
  Administered 2017-01-28: 20 mL via INTRAVENOUS

## 2017-06-29 ENCOUNTER — Emergency Department (HOSPITAL_COMMUNITY): Payer: 59

## 2017-06-29 ENCOUNTER — Other Ambulatory Visit: Payer: Self-pay

## 2017-06-29 ENCOUNTER — Emergency Department (HOSPITAL_COMMUNITY)
Admission: EM | Admit: 2017-06-29 | Discharge: 2017-06-29 | Disposition: A | Discharge status: Home / Self Care | Payer: 59 | Attending: Physician Assistant | Admitting: Physician Assistant

## 2017-06-29 ENCOUNTER — Encounter (HOSPITAL_COMMUNITY): Payer: Self-pay

## 2017-06-29 DIAGNOSIS — I471 Supraventricular tachycardia: Secondary | ICD-10-CM | POA: Diagnosis not present

## 2017-06-29 DIAGNOSIS — Z8543 Personal history of malignant neoplasm of ovary: Secondary | ICD-10-CM | POA: Insufficient documentation

## 2017-06-29 DIAGNOSIS — Z853 Personal history of malignant neoplasm of breast: Secondary | ICD-10-CM | POA: Insufficient documentation

## 2017-06-29 DIAGNOSIS — J45909 Unspecified asthma, uncomplicated: Secondary | ICD-10-CM | POA: Insufficient documentation

## 2017-06-29 DIAGNOSIS — I10 Essential (primary) hypertension: Secondary | ICD-10-CM | POA: Insufficient documentation

## 2017-06-29 DIAGNOSIS — R000 Tachycardia, unspecified: Secondary | ICD-10-CM | POA: Diagnosis present

## 2017-06-29 LAB — CBC
HEMATOCRIT: 44.8 % (ref 36.0–46.0)
HEMOGLOBIN: 14.5 g/dL (ref 12.0–15.0)
MCH: 29.1 pg (ref 26.0–34.0)
MCHC: 32.4 g/dL (ref 30.0–36.0)
MCV: 89.8 fL (ref 78.0–100.0)
Platelets: 305 10*3/uL (ref 150–400)
RBC: 4.99 MIL/uL (ref 3.87–5.11)
RDW: 13.8 % (ref 11.5–15.5)
WBC: 13 10*3/uL — ABNORMAL HIGH (ref 4.0–10.5)

## 2017-06-29 LAB — BASIC METABOLIC PANEL
Anion gap: 12 (ref 5–15)
BUN: 18 mg/dL (ref 6–20)
CHLORIDE: 106 mmol/L (ref 101–111)
CO2: 23 mmol/L (ref 22–32)
Calcium: 9.7 mg/dL (ref 8.9–10.3)
Creatinine, Ser: 0.95 mg/dL (ref 0.44–1.00)
GFR calc Af Amer: >60 mL/min (ref >60)
GLUCOSE: 113 mg/dL — AB (ref 65–99)
POTASSIUM: 4.4 mmol/L (ref 3.5–5.1)
Sodium: 141 mmol/L (ref 135–145)

## 2017-06-29 LAB — I-STAT TROPONIN, ED: Troponin i, poc: 0.01 ng/mL (ref 0.00–0.08)

## 2017-06-29 LAB — TSH: TSH: 2.678 u[IU]/mL (ref 0.350–4.500)

## 2017-06-29 MED ORDER — ADENOSINE 6 MG/2ML IV SOLN
INTRAVENOUS | Status: AC
  Filled 2017-06-29: qty 6

## 2017-06-29 NOTE — ED Provider Notes (Signed)
[facility] EMERGENCY DEPARTMENT Provider Note   CSN: [id number] Arrival date & time: 06/29/17  1414     History   Chief Complaint Chief Complaint  Patient presents with  . Tachycardia    HPI LANELLE LINDO is a 52 y.o. female.  HPI   Patient is a very pleasant 52 year old female presenting with elevated heart rate.  Patient was a former Health visitor.  She reports that today at 1 PM she felt a racing heartbeat.  Patient is never had this previously.  Patient does report drinking a lot of caffeine, and feeling dehydrated today.  Patient tried Valsalva movement maneuvers on her way in and were ultimately unsuccessful.  Denies any chest pain.  Past Medical History:  Diagnosis Date  . Asthma   . Cancer ([place])   . Heart murmur   . Hypertension     Patient Active Problem List   Diagnosis Date Noted  . Essential hypertension 11/07/2013  . Anxiety 11/07/2013  . Asthma 11/07/2013  . Ovarian cancer ([place]) 11/07/2013  . Breast cancer ([place]) 11/07/2013  . Family history of breast cancer 07/15/2011  . Fibrocystic breast changes 07/15/2011    Past Surgical History:  Procedure Laterality Date  . ABDOMINAL HYSTERECTOMY  04/2010  . NASAL SINUS SURGERY  2005    OB History    No data available       Home Medications    Prior to Admission medications   Medication Sig Start Date End Date Taking? Authorizing Provider  acetaminophen-codeine 120-12 MG/5ML solution Take 5 mLs by mouth every 6 (six) hours as needed for pain. 11/25/12   Moreno-Coll, Adlih, MD  albuterol (PROVENTIL HFA;VENTOLIN HFA) 108 (90 BASE) MCG/ACT inhaler Inhale 2 puffs into the lungs every 6 (six) hours as needed for wheezing.    [provider]  buPROPion (WELLBUTRIN XL) 150 MG 24 hr tablet daily. 06/02/11   [provider]  busPIRone (BUSPAR) 5 MG tablet daily. 07/12/11   [provider]  CALCIUM PO Take 1,000 Units by mouth daily.    [provider]    citalopram (CELEXA) 10 MG tablet Take 10 mg by mouth daily.    [provider]  hydrocortisone 2.5 % cream Ad lib. 06/16/11   [provider]  lisinopril (PRINIVIL,ZESTRIL) 10 MG tablet  07/17/12   [provider]  Multiple Vitamin (MULTIVITAMIN) capsule Take 1 capsule by mouth daily.    [provider]  omeprazole (PRILOSEC) 20 MG capsule 1 tab by mouth daily when necessary 11/25/12   Moreno-Coll, Adlih, MD  ondansetron (ZOFRAN-ODT) 4 MG disintegrating tablet Take 1 tablet (4 mg total) by mouth every 12 (twelve) hours as needed for nausea. 11/25/12   Moreno-Coll, Adlih, MD  QVAR 40 MCG/ACT inhaler daily. 05/24/11   [provider]  ranitidine (ZANTAC) 150 MG capsule Take 1 capsule (150 mg total) by mouth daily. 11/25/12   Moreno-Coll, Adlih, MD  sucralfate (CARAFATE) 1 GM/10ML suspension Take 10 mLs (1 g total) by mouth 4 (four) times daily -  with meals and at bedtime. 11/25/12   Moreno-Coll, Adlih, MD  SUMAtriptan (IMITREX) 50 MG tablet Take 50 mg by mouth every 2 (two) hours as needed for migraine or headache. May repeat in 2 hours if headache persists or recurs.    [provider]  VITAMIN E PO Take 1,000 Units by mouth.    [provider]    Family History Family History  Problem Relation Age of Onset  .  Cancer Mother        breast  . Cancer Sister        ovarian  . Cancer Maternal Grandmother        skin  . Breast cancer Maternal Aunt     Social History Social History   Tobacco Use  . Smoking status: Never Smoker  Substance Use Topics  . Alcohol use: No    Frequency: Never  . Drug use: No     Allergies   Latex   Review of Systems Review of Systems  Constitutional: Negative for activity change and fatigue.  Respiratory: Negative for chest tightness.   Gastrointestinal: Negative for abdominal distention.  Skin: Negative for rash.  Psychiatric/Behavioral: Negative for behavioral problems.  All other systems  reviewed and are negative.    Physical Exam Updated Vital Signs BP (!) 144/89   Pulse (!) 124   Temp 98 F (36.7 C) (Oral)   Resp 13   Ht 5\' 7"  (1.702 m)   Wt 90.7 kg (200 lb)   SpO2 100%   BMI 31.32 kg/m   Physical Exam  Constitutional: She is oriented to person, place, and time. She appears well-developed and well-nourished.  HENT:  Head: Normocephalic and atraumatic.  Eyes: Right eye exhibits no discharge. Left eye exhibits no discharge.  Cardiovascular: Normal rate, regular rhythm and normal heart sounds.  No murmur heard. Pulmonary/Chest: Effort normal and breath sounds normal. She has no wheezes. She has no rales.  Abdominal: Soft. She exhibits no distension. There is no tenderness.  Neurological: She is oriented to person, place, and time.  Skin: Skin is warm and dry. She is not diaphoretic.  Psychiatric: She has a normal mood and affect.  Nursing note and vitals reviewed.    ED Treatments / Results  Labs (all labs ordered are listed, but only abnormal results are displayed) Labs Reviewed  CBC - Abnormal; Notable for the following components:      Result Value   WBC 13.0 (*)    All other components within normal limits  BASIC METABOLIC PANEL  I-STAT TROPONIN, ED    EKG  EKG Interpretation None       Radiology No results found.  Procedures Procedures (including critical care time)  CRITICAL CARE Performed by: Gardiner Sleeper Total critical care time: 30 minutes Critical care time was exclusive of separately billable procedures and treating other patients. Critical care was necessary to treat or prevent imminent or life-threatening deterioration. Critical care was time spent personally by me on the following activities: development of treatment plan with patient and/or surrogate as well as nursing, discussions with consultants, evaluation of patient's response to treatment, examination of patient, obtaining history from patient or surrogate,  ordering and performing treatments and interventions, ordering and review of laboratory studies, ordering and review of radiographic studies, pulse oximetry and re-evaluation of patient's condition.  Medications Ordered in ED Medications  adenosine (ADENOCARD) 6 MG/2ML injection (not administered)     Initial Impression / Assessment and Plan / ED Course  I have reviewed the triage vital signs and the nursing notes.  Pertinent labs & imaging results that were available during my care of the patient were reviewed by me and considered in my medical decision making (see chart for details).    Patient is a very pleasant 52 year old female presenting with elevated heart rate.  Patient was a former Health visitor.  She reports that today at 1 PM she felt a racing heartbeat.  Patient is never  had this previously.  Patient does report drinking a lot of caffeine, and feeling dehydrated today.  Patient tried Valsalva movement maneuvers on her way in and were ultimately unsuccessful.  Denies any chest pain.  3:08 PM   Very well-appearing 53 year old female.  Patient in SVT.  While getting set up to run adenosine, did a maneuver where patient was able to blow in a syringe and then lifted her feet above her head. Pt tolerated well.    Patient converted with this maneuver.  5:18 PM Patient still with mild tachycardia.  Likely from caffeine use, lack of sleep lack of hydration.  Patient has no risk factors for DVT or PE including no recent travel no recent immobilization, no shortness of breath, no oral estrogens.  Final Clinical Impressions(s) / ED Diagnoses   Final diagnoses:  None    ED Discharge Orders    None       Dario Yono, Fredia Sorrow, MD 06/29/17 1718

## 2017-06-29 NOTE — ED Notes (Signed)
Patient transported to X-ray 

## 2017-06-29 NOTE — ED Notes (Signed)
Pt in SVT, taken back to room by this RN. Pt has an IV. Pt has tried valsalva and nothing has worked.

## 2017-06-29 NOTE — Discharge Instructions (Signed)
We are unsure what caused you to go into SVT today.  Will need to avoid caffeine and be sure to stay hydrated.  Sure you are getting plenty of sleep.  Your thyroid studies were reassuring.  Please follow-up with your primary care physician.  We want you to follow-up with a cardiologist in order to get further evaluation.

## 2017-06-29 NOTE — ED Triage Notes (Signed)
Pt endorses tachycardia/palpitations x 1 hour. Has no cardiac hx. Hx of htn. Hypertensive in triage and tachy at 189. Endorses mild shob, denies cp. Axox4.

## 2017-08-18 ENCOUNTER — Other Ambulatory Visit: Payer: Self-pay | Admitting: Obstetrics and Gynecology

## 2017-08-18 DIAGNOSIS — Z1231 Encounter for screening mammogram for malignant neoplasm of breast: Secondary | ICD-10-CM

## 2017-08-19 ENCOUNTER — Other Ambulatory Visit: Payer: Self-pay | Admitting: General Surgery

## 2017-08-19 DIAGNOSIS — N6019 Diffuse cystic mastopathy of unspecified breast: Secondary | ICD-10-CM

## 2017-09-06 NOTE — Progress Notes (Signed)
Cardiology Office Note   Date:  09/09/2017   ID:  Amber Rowe, DOB January 07, 1966, MRN [id number]  PCP:  Marius Ditch, MD  Cardiologist:   Jenkins Rouge, MD   No chief complaint on file.     History of Present Illness: Amber Rowe is a 52 y.o. female who presents for consultation regarding SVT. Referred by Dr Maxwell Caul and [facility] ER Reviewed ED notes from 06/29/17 She is a former ER nurse. Sudden onset heart racing in afternoon. Drinks lots of caffeine. No Dyspnea, chest pain or syncope. Never had these types of palpitations before. She tried valsalva maneuvers to no avail.  History of asthma and HTN ? Heart murmur.  In ER blew into a syringe with elevated feet per ER doctor and converted from SVT To NSR. D/C home with no meds Labs including TSH were normal CXR NAD  ECG review 06/29/17 showed narrow complex tachycardia rate 180 no pre excitation  No post conversion ECG in Epic ECG from 11/25/12 was normal   She has some stress doing new job at [employer]. Son Amber Rowe passed tragically on football [place] at [place] 2 years ago. Husband is EMT   Indicates sister had WBP with ? Ablation    Past Medical History:  Diagnosis Date  . Asthma   . Cancer ([place])   . Heart murmur   . Hypertension     Past Surgical History:  Procedure Laterality Date  . ABDOMINAL HYSTERECTOMY  04/2010  . NASAL SINUS SURGERY  2005     Current Outpatient Medications  Medication Sig Dispense Refill  . albuterol (PROVENTIL HFA;VENTOLIN HFA) 108 (90 BASE) MCG/ACT inhaler Inhale 2 puffs into the lungs every 6 (six) hours as needed for wheezing.    Marland Kitchen ALPRAZolam (XANAX) 0.5 MG tablet Take 0.5 mg by mouth at bedtime as needed for sleep.    Marland Kitchen buPROPion (WELLBUTRIN XL) 300 MG 24 hr tablet Take 300 mg by mouth daily.     . Doxylamine Succinate, Sleep, (UNISOM PO) Take 0.5 tablets by mouth at bedtime.    Marland Kitchen escitalopram (LEXAPRO) 5 MG tablet Take 2.5 mg by mouth daily.     . famotidine (PEPCID) 40 MG  tablet Take 40 mg by mouth daily.    . hydrocortisone 2.5 % cream Apply 1 application topically as needed (dry skin).     Marland Kitchen lisinopril (PRINIVIL,ZESTRIL) 10 MG tablet Take 5 mg by mouth daily.     . mometasone (ASMANEX) 220 MCG/INH inhaler Inhale 2 puffs into the lungs as directed.    . Multiple Vitamin (MULTIVITAMIN) capsule Take 1 capsule by mouth daily.    Marland Kitchen omeprazole (PRILOSEC) 20 MG capsule Take 20 mg by mouth daily.    Marland Kitchen QVAR 40 MCG/ACT inhaler Inhale 1 puff into the lungs at bedtime.     . SUMAtriptan (IMITREX) 50 MG tablet Take 12.5 mg by mouth every 2 (two) hours as needed for migraine or headache. May repeat in 2 hours if headache persists or recurs.     Marland Kitchen VITAMIN E PO Take 180 mg by mouth daily.      No current facility-administered medications for this visit.     Allergies:   Latex and Erythromycin base    Social History:  The patient  reports that she has never smoked. She has never used smokeless tobacco. She reports that she does not drink alcohol or use drugs.   Family History:  The patient's family history includes Breast cancer (age of  onset: 19) in her mother; Breast cancer (age of onset: 39) in her cousin; Breast cancer (age of onset: 65) in her maternal aunt; Cancer in her maternal grandmother, mother, and sister.    ROS:  Please see the history of present illness.   Otherwise, review of systems are positive for none.   All other systems are reviewed and negative.    PHYSICAL EXAM: VS:  BP 128/82   Pulse 76   Ht 5\' 7"  (1.702 m)   Wt 209 lb (94.8 kg)   BMI 32.73 kg/m  , BMI Body mass index is 32.73 kg/m. Affect appropriate Healthy:  appears stated age 83: normal Neck supple with no adenopathy JVP normal no bruits no thyromegaly Lungs clear with no wheezing and good diaphragmatic motion Heart:  S1/S2 no murmur, no rub, gallop or click PMI normal Abdomen: benighn, BS positve, no tenderness, no AAA no bruit.  No HSM or HJR Distal pulses intact with no  bruits No edema Neuro non-focal Skin warm and dry No muscular weakness    EKG:  See HPI 09/09/17 SR rate 67 RAD nonspecific ST    Recent Labs: 06/29/2017: BUN 18; Creatinine, Ser 0.95; Hemoglobin 14.5; Platelets 305; Potassium 4.4; Sodium 141; TSH 2.678    Lipid Panel No results found for: CHOL, TRIG, HDL, CHOLHDL, VLDL, LDLCALC, LDLDIRECT    Wt Readings from Last 3 Encounters:  09/09/17 209 lb (94.8 kg)  06/29/17 200 lb (90.7 kg)  09/15/13 195 lb (88.5 kg)      Other studies Reviewed: Additional studies/ records that were reviewed today include: ER notes, CXR , labs and ECG;s .    ASSESSMENT AND PLAN:  1.  SVT: first episode with no high risk features. Limit caffeine Echo to r/o structural heart disease Prescribe PRN inderal ETT to r/o exercise induced arrhythmia or concealed accessory pathway  2. Asthma: claritin and flonase during pollen season Qvar and rescue inhaler stble 3. Anxiety / Depression On lexapro, Wellbutrin and xanax f/u primary  4. HTN: Well controlled.  Continue current medications and low sodium Dash type diet.   5. GERD: Discussed weight loss and low carb diet continue pepcid    Current medicines are reviewed at length with the patient today.  The patient does not have concerns regarding medicines.  The following changes have been made:  no change  Labs/ tests ordered today include: TTE ETT  No orders of the defined types were placed in this encounter.    Disposition:   FU with cardiology PRN for recurrence would have her see EP      Signed, Jenkins Rouge, MD  09/09/2017 8:40 AM    [address], [place], [place]  [address] Phone: [phone]; Fax: [phone]

## 2017-09-08 ENCOUNTER — Ambulatory Visit
Admission: RE | Admit: 2017-09-08 | Discharge: 2017-09-08 | Disposition: A | Discharge status: Home / Self Care | Payer: 59 | Source: Ambulatory Visit | Attending: Obstetrics and Gynecology | Admitting: Obstetrics and Gynecology

## 2017-09-08 DIAGNOSIS — Z1231 Encounter for screening mammogram for malignant neoplasm of breast: Secondary | ICD-10-CM

## 2017-09-09 ENCOUNTER — Encounter: Payer: Self-pay | Admitting: Cardiovascular Disease

## 2017-09-09 ENCOUNTER — Ambulatory Visit: Payer: 59 | Admitting: Cardiovascular Disease

## 2017-09-09 VITALS — BP 128/82 | HR 76 | Ht 67.0 in | Wt 209.0 lb

## 2017-09-09 DIAGNOSIS — I471 Supraventricular tachycardia: Secondary | ICD-10-CM | POA: Diagnosis not present

## 2017-09-09 DIAGNOSIS — I10 Essential (primary) hypertension: Secondary | ICD-10-CM

## 2017-09-09 MED ORDER — PROPRANOLOL HCL 10 MG PO TABS: 10.0000 mg | ORAL_TABLET | ORAL | 3 refills | Status: AC | PRN

## 2017-09-09 NOTE — Patient Instructions (Addendum)
Medication Instructions:  Your physician has recommended you make the following change in your medication:  1-Take Inderal 10 mg by mouth as needed for palpitations  Labwork: NONE  Testing/Procedures: Your physician has requested that you have an exercise tolerance test. For further information please visit [email or web address]. Please also follow instruction sheet, as given.  Your physician has requested that you have an echocardiogram. Echocardiography is a painless test that uses sound waves to create images of your heart. It provides your doctor with information about the size and shape of your heart and how well your heart's chambers and valves are working. This procedure takes approximately one hour. There are no restrictions for this procedure.  Follow-Up: Your physician wants you to follow-up in: 12 months with Dr. Johnsie Cancel. You will receive a reminder letter in the mail two months in advance. If you don't receive a letter, please call our office to schedule the follow-up appointment.   If you need a refill on your cardiac medications before your next appointment, please call your pharmacy.

## 2017-09-23 ENCOUNTER — Other Ambulatory Visit (HOSPITAL_COMMUNITY): Payer: 59

## 2017-10-11 ENCOUNTER — Other Ambulatory Visit (HOSPITAL_COMMUNITY): Payer: 59

## 2017-10-12 ENCOUNTER — Other Ambulatory Visit (HOSPITAL_COMMUNITY): Payer: 59

## 2017-10-13 ENCOUNTER — Telehealth (HOSPITAL_COMMUNITY): Payer: Self-pay | Admitting: Cardiovascular Disease

## 2017-10-15 ENCOUNTER — Other Ambulatory Visit (HOSPITAL_COMMUNITY): Payer: 59

## 2017-10-20 ENCOUNTER — Ambulatory Visit (INDEPENDENT_AMBULATORY_CARE_PROVIDER_SITE_OTHER): Payer: 59

## 2017-10-20 ENCOUNTER — Ambulatory Visit (HOSPITAL_COMMUNITY): Payer: 59 | Attending: Cardiovascular Disease

## 2017-10-20 ENCOUNTER — Other Ambulatory Visit: Payer: Self-pay

## 2017-10-20 DIAGNOSIS — J45909 Unspecified asthma, uncomplicated: Secondary | ICD-10-CM | POA: Insufficient documentation

## 2017-10-20 DIAGNOSIS — I471 Supraventricular tachycardia: Secondary | ICD-10-CM | POA: Diagnosis present

## 2017-10-20 DIAGNOSIS — I10 Essential (primary) hypertension: Secondary | ICD-10-CM

## 2017-10-20 DIAGNOSIS — R011 Cardiac murmur, unspecified: Secondary | ICD-10-CM | POA: Insufficient documentation

## 2017-10-20 DIAGNOSIS — I340 Nonrheumatic mitral (valve) insufficiency: Secondary | ICD-10-CM | POA: Diagnosis not present

## 2017-10-20 DIAGNOSIS — I119 Hypertensive heart disease without heart failure: Secondary | ICD-10-CM | POA: Diagnosis not present

## 2017-10-20 LAB — EXERCISE TOLERANCE TEST
CSEPED: 9 min
CSEPEW: 10.1 METS
CSEPHR: 98 %
CSEPPHR: 169 {beats}/min
Exercise duration (sec): 0 s
MPHR: 171 {beats}/min
RPE: 16
Rest HR: 87 {beats}/min

## 2017-10-26 NOTE — Telephone Encounter (Signed)
10/13/2017 02:02 PM Phone (Outgoing) Amber, Rowe (Self) [phone] (H)   Left Message - Called pt and lmsg for her to inform her that appt time had been changed to 8:30 due to techs having a meeting.     By Verdene Rio

## 2018-02-05 LAB — GLUCOSE, POCT (MANUAL RESULT ENTRY): POC GLUCOSE: 104 mg/dL — AB (ref 70–99)

## 2018-02-11 ENCOUNTER — Ambulatory Visit
Admission: RE | Admit: 2018-02-11 | Discharge: 2018-02-11 | Disposition: A | Discharge status: Home / Self Care | Payer: 59 | Source: Ambulatory Visit | Attending: General Surgery | Admitting: General Surgery

## 2018-02-11 DIAGNOSIS — N6019 Diffuse cystic mastopathy of unspecified breast: Secondary | ICD-10-CM

## 2018-02-11 MED ORDER — GADOBENATE DIMEGLUMINE 529 MG/ML IV SOLN
19.0000 mL | Freq: Once | INTRAVENOUS | Status: AC | PRN
  Administered 2018-02-11: 19 mL via INTRAVENOUS

## 2018-03-18 ENCOUNTER — Encounter: Payer: Self-pay | Admitting: Gastroenterology

## 2018-03-25 HISTORY — PX: COLONOSCOPY: SHX174

## 2018-04-15 ENCOUNTER — Ambulatory Visit (AMBULATORY_SURGERY_CENTER): Payer: Self-pay

## 2018-04-15 VITALS — Ht 67.0 in | Wt 199.4 lb

## 2018-04-15 DIAGNOSIS — Z1211 Encounter for screening for malignant neoplasm of colon: Secondary | ICD-10-CM

## 2018-04-15 NOTE — Progress Notes (Signed)
Per pt, no allergies to soy or egg products.Pt not taking any weight loss meds or using  O2 at home.  Pt refused emmi video. 

## 2018-04-18 ENCOUNTER — Encounter: Payer: Self-pay | Admitting: Gastroenterology

## 2018-05-02 ENCOUNTER — Encounter: Payer: Self-pay | Admitting: Gastroenterology

## 2018-05-02 ENCOUNTER — Ambulatory Visit (AMBULATORY_SURGERY_CENTER): Payer: 59 | Admitting: Gastroenterology

## 2018-05-02 VITALS — BP 104/77 | HR 81 | Temp 97.5°F | Resp 13 | Ht 67.0 in | Wt 199.0 lb

## 2018-05-02 DIAGNOSIS — Z1211 Encounter for screening for malignant neoplasm of colon: Secondary | ICD-10-CM | POA: Diagnosis present

## 2018-05-02 DIAGNOSIS — K635 Polyp of colon: Secondary | ICD-10-CM

## 2018-05-02 DIAGNOSIS — D123 Benign neoplasm of transverse colon: Secondary | ICD-10-CM

## 2018-05-02 MED ORDER — SODIUM CHLORIDE 0.9 % IV SOLN: 500.0000 mL | Freq: Once | INTRAVENOUS | Status: AC

## 2018-05-02 NOTE — Progress Notes (Signed)
Report to PACU, RN, vss, BBS= Clear.  

## 2018-05-02 NOTE — Patient Instructions (Signed)
YOU HAD AN ENDOSCOPIC PROCEDURE TODAY AT THE [facility]:   Refer to the procedure report that was given to you for any specific questions about what was found during the examination.  If the procedure report does not answer your questions, please call your gastroenterologist to clarify.  If you requested that your care partner not be given the details of your procedure findings, then the procedure report has been included in a sealed envelope for you to review at your convenience later.  YOU SHOULD EXPECT: Some feelings of bloating in the abdomen. Passage of more gas than usual.  Walking can help get rid of the air that was put into your GI tract during the procedure and reduce the bloating. If you had a lower endoscopy (such as a colonoscopy or flexible sigmoidoscopy) you may notice spotting of blood in your stool or on the toilet paper. If you underwent a bowel prep for your procedure, you may not have a normal bowel movement for a few days.  Please Note:  You might notice some irritation and congestion in your nose or some drainage.  This is from the oxygen used during your procedure.  There is no need for concern and it should clear up in a day or so.  SYMPTOMS TO REPORT IMMEDIATELY:   Following lower endoscopy (colonoscopy or flexible sigmoidoscopy):  Excessive amounts of blood in the stool  Significant tenderness or worsening of abdominal pains  Swelling of the abdomen that is new, acute  Fever of 100F or higher  For urgent or emergent issues, a gastroenterologist can be reached at any hour by calling [phone].   DIET:  We do recommend a small meal at first, but then you may proceed to your regular diet.  Drink plenty of fluids but you should avoid alcoholic beverages for 24 hours.  ACTIVITY:  You should plan to take it easy for the rest of today and you should NOT DRIVE or use heavy machinery until tomorrow (because of the sedation medicines used during the test).     FOLLOW UP: Our staff will call the number listed on your records the next business day following your procedure to check on you and address any questions or concerns that you may have regarding the information given to you following your procedure. If we do not reach you, we will leave a message.  However, if you are feeling well and you are not experiencing any problems, there is no need to return our call.  We will assume that you have returned to your regular daily activities without incident.  If any biopsies were taken you will be contacted by phone or by letter within the next 1-3 weeks.  Please call us at [phone] if you have not heard about the biopsies in 3 weeks.   Await for biopsy results Polyps (handout given)    SIGNATURES/CONFIDENTIALITY: You and/or your care partner have signed paperwork which will be entered into your electronic medical record.  These signatures attest to the fact that that the information above on your After Visit Summary has been reviewed and is understood.  Full responsibility of the confidentiality of this discharge information lies with you and/or your care-partner. 

## 2018-05-02 NOTE — Op Note (Signed)
[facility] Patient Name: Amber Rowe Procedure Date: 05/02/2018 8:45 AM MRN: [id number] Endoscopist: Thornton Park MD, MD Age: 52 Referring MD:  Date of Birth: September 16, 1965 Gender: Female Account #: [id number] Procedure:                Colonoscopy Indications:              Screening for colorectal malignant neoplasm, This                            is the patient's first colonoscopy. No known family                            history of colon cancer or polyps. Medicines:                See the Anesthesia note for documentation of the                            administered medications Procedure:                Pre-Anesthesia Assessment:                           - Prior to the procedure, a History and Physical                            was performed, and patient medications and                            allergies were reviewed. The patient's tolerance of                            previous anesthesia was also reviewed. The risks                            and benefits of the procedure and the sedation                            options and risks were discussed with the patient.                            All questions were answered, and informed consent                            was obtained. Prior Anticoagulants: The patient has                            taken no previous anticoagulant or antiplatelet                            agents. ASA Grade Assessment: II - A patient with                            mild systemic disease. After reviewing the risks  and benefits, the patient was deemed in                            satisfactory condition to undergo the procedure.                           After obtaining informed consent, the colonoscope                            was passed under direct vision. Throughout the                            procedure, the patient's blood pressure, pulse, and                            oxygen saturations  were monitored continuously. The                            Colonoscope was introduced through the anus and                            advanced to the the terminal ileum, with                            identification of the appendiceal orifice and IC                            valve. The colonoscopy was performed without                            difficulty. The patient tolerated the procedure                            well. The quality of the bowel preparation was good. Scope In: 8:53:39 AM Scope Out: 9:07:05 AM Scope Withdrawal Time: 0 hours 10 minutes 32 seconds  Total Procedure Duration: 0 hours 13 minutes 26 seconds  Findings:                 The perianal and digital rectal examinations were                            normal.                           A 10 mm polyp was found in the proximal descending                            colon. The polyp was pedunculated. The polyp was                            removed with a cold snare. Resection and retrieval                            were complete.  The exam was otherwise without abnormality on                            direct and retroflexion views. Complications:            No immediate complications. Estimated Blood Loss:     Estimated blood loss: none. Impression:               - One 10 mm polyp in the proximal descending colon,                            removed with a cold snare. Resected and retrieved.                           - The examination was otherwise normal on direct                            and retroflexion views. Recommendation:           - Discharge patient to home.                           - Resume regular diet today.                           - Await pathology results.                           - Surveillance recommendations will be made after                            reviewing the pathology results given the size of                            the polyp. Thornton Park MD,  MD 05/02/2018 9:12:29 AM This report has been signed electronically.

## 2018-05-02 NOTE — Progress Notes (Signed)
Called to room to assist during endoscopic procedure.  Patient ID and intended procedure confirmed with present staff. Received instructions for my participation in the procedure from the performing physician.  

## 2018-05-02 NOTE — Progress Notes (Signed)
Pt's states no medical or surgical changes since previsit or office visit. 

## 2018-05-03 ENCOUNTER — Telehealth: Payer: Self-pay | Admitting: *Deleted

## 2018-05-03 NOTE — Telephone Encounter (Signed)
  Follow up Call-  Call back number 05/02/2018  Post procedure Call Back phone  # [phone]  Permission to leave phone message Yes  Some recent data might be hidden     Patient questions:  Do you have a fever, pain , or abdominal swelling? No. Pain Score  0 *  Have you tolerated food without any problems? Yes.    Have you been able to return to your normal activities? Yes.    Do you have any questions about your discharge instructions: Diet   No. Medications  No. Follow up visit  No.  Do you have questions or concerns about your Care? No.  Actions: * If pain score is 4 or above: No action needed, pain <4.

## 2018-06-06 ENCOUNTER — Telehealth: Payer: Self-pay

## 2018-06-06 NOTE — Telephone Encounter (Signed)
Thornton Park, MD sent to Jolinda Croak, RN; Marlon Pel, RN        Please call the patient. I wanted to follow-up with her after her recent colonoscopy. I apologize for the delay in getting the pathology results to her. Last month the pathologist said that the mucosa that was removed during her procedure was not actually a polyp. I requested a second opinion review of the biopsy results for confirmation. I spoke with the pathologist myself, and although the area that was removed appeared to be a polyp, there is no evidence for polyp tissue. This is overall reassuring. Given these results she should continue with routine colorectal cancer screening. Current guidelines suggest a follow-up exam in 10 years or earlier with the development of any new symptoms. Thank you.   Previous Messages    ----- Message -----  From: Jolinda Croak, RN  Sent: 06/03/2018  4:49 PM EST  To: Thornton Park, MD   Dr Tarri Glenn, I don's see a final path on this or your recommendations. Please advise.  Thanks! [facility]      Patient notified of results and recall recommendations.

## 2018-08-08 ENCOUNTER — Other Ambulatory Visit: Payer: Self-pay | Admitting: General Surgery

## 2018-08-08 DIAGNOSIS — Z803 Family history of malignant neoplasm of breast: Secondary | ICD-10-CM

## 2018-08-08 DIAGNOSIS — Z1231 Encounter for screening mammogram for malignant neoplasm of breast: Secondary | ICD-10-CM

## 2018-08-13 ENCOUNTER — Encounter (HOSPITAL_COMMUNITY): Payer: Self-pay

## 2018-08-13 ENCOUNTER — Ambulatory Visit (INDEPENDENT_AMBULATORY_CARE_PROVIDER_SITE_OTHER): Payer: 59

## 2018-08-13 ENCOUNTER — Ambulatory Visit (HOSPITAL_COMMUNITY)
Admission: EM | Admit: 2018-08-13 | Discharge: 2018-08-13 | Disposition: A | Discharge status: Home / Self Care | Payer: 59 | Attending: Family Medicine | Admitting: Family Medicine

## 2018-08-13 DIAGNOSIS — W293XXA Contact with powered garden and outdoor hand tools and machinery, initial encounter: Secondary | ICD-10-CM

## 2018-08-13 DIAGNOSIS — S61213A Laceration without foreign body of left middle finger without damage to nail, initial encounter: Secondary | ICD-10-CM

## 2018-08-13 DIAGNOSIS — S61215A Laceration without foreign body of left ring finger without damage to nail, initial encounter: Secondary | ICD-10-CM | POA: Diagnosis not present

## 2018-08-13 MED ORDER — HYDROCODONE-ACETAMINOPHEN 5-325 MG PO TABS: 1.0000 | ORAL_TABLET | Freq: Four times a day (QID) | ORAL | 0 refills | Status: DC | PRN

## 2018-08-13 MED ORDER — ACETAMINOPHEN 325 MG PO TABS: 975.0000 mg | ORAL_TABLET | Freq: Once | ORAL | Status: DC

## 2018-08-13 MED ORDER — TETANUS-DIPHTH-ACELL PERTUSSIS 5-2.5-18.5 LF-MCG/0.5 IM SUSP
0.5000 mL | Freq: Once | INTRAMUSCULAR | Status: AC
  Administered 2018-08-13: 0.5 mL via INTRAMUSCULAR

## 2018-08-13 MED ORDER — TETANUS-DIPHTH-ACELL PERTUSSIS 5-2.5-18.5 LF-MCG/0.5 IM SUSP
INTRAMUSCULAR | Status: AC
  Filled 2018-08-13: qty 0.5

## 2018-08-13 NOTE — Discharge Instructions (Signed)
Be aware, pain medications may cause drowsiness. Please do not drive, operate heavy machinery or make important decisions while on this medication, it can cloud your judgement.  

## 2018-08-13 NOTE — ED Triage Notes (Signed)
Pt present laceration on her left hand/ 3rd and 4th fingers.

## 2018-08-13 NOTE — ED Provider Notes (Signed)
[place]   [id number] 08/13/18 Arrival Time: 1601  ASSESSMENT & PLAN:  1. Laceration of left middle finger without foreign body without damage to nail, initial encounter   2. Laceration of left ring finger without foreign body without damage to nail, initial encounter    I have personally viewed the imaging studies ordered this visit. No fracture or foreign bodies appreciated.  Meds ordered this encounter  Medications  . acetaminophen (TYLENOL) tablet 975 mg  . Tdap (BOOSTRIX) injection 0.5 mL  . HYDROcodone-acetaminophen (NORCO/VICODIN) 5-325 MG tablet    Sig: Take 1 tablet by mouth every 6 (six) hours as needed for moderate pain or severe pain.    Dispense:  4 tablet    Refill:  0   Procedure: Verbal consent obtained. Patient provided with risks and alternatives to the procedure. Wound copiously irrigated with NS then cleansed with betadine. Anesthetized with 2 mL of lidocaine without epinephrine. Wound carefully explored. No foreign body, tendon injury, or nonviable tissue were noted. Using sterile technique 3 interrupted 6-0 Prolene sutures were placed to re-approximate the wound of her 3rd fingertip; 1 6-0 Prolene suture to re-approximate the wound of her 4th fingertip. She tolerated procedure well. No complications. Minimal bleeding. Pressure dressings applied. Patient advised to look for and return for any signs of infection such as redness, swelling, discharge, or worsening pain. Return for suture removal in 7 days.  She was able to remove ring from her 4th finger.   Discharge Instructions     Be aware, pain medications may cause drowsiness. Please do not drive, operate heavy machinery or make important decisions while on this medication, it can cloud your judgement.     Reviewed expectations re: course of current medical issues. Questions answered. Outlined signs and symptoms indicating need for more acute intervention. Patient verbalized understanding.  After Visit Summary given.   SUBJECTIVE:  Amber Rowe is a 53 y.o. female who presents with lacerations of her L 3rd and 4th fingertips. Hedge trimmers vs fingers. Today. Moderate bleeding. Moderate pain. Washed under faucet at home. No reported sensation changes or weakness.  Td UTD: No.  ROS: As per HPI.   OBJECTIVE:  Vitals:   08/13/18 1434  BP: 136/80  Pulse: 91  Resp: 18  Temp: 98.3 F (36.8 C)  TempSrc: Oral  SpO2: 97%    General appearance: alert; no distress Skin: L 3rd fingertip: jagged/irregular laceration of distal palmar fingertip; size: approx 1 cm; no foreign bodies, ragged edges; with active bleeding; no nail involvement L 4th fingertip: linear laceration of distal fingertip; size: approx 1 cm; clean wound edges, no foreign bodies; with active bleeding; no nail involvement - both fingers with intact distal sensation and normal ROM Psychological: alert and cooperative; normal mood and affect   Imaging: Dg Hand Complete Left  Result Date: 08/13/2018 CLINICAL DATA:  Hedge trimmer injury with lacerations, initial encounter EXAM: LEFT HAND - COMPLETE 3+ VIEW COMPARISON:  None. FINDINGS: Soft tissue abnormality is noted distally in the third and fourth digits consistent with the given clinical history. No radiopaque foreign body is seen. No fracture or dislocation is noted. IMPRESSION: Soft tissue injury without bony abnormality. Electronically Signed   By: Inez Catalina M.D.   On: 08/13/2018 15:14      Allergies  Allergen Reactions  . Latex     swelling  . Erythromycin Base Diarrhea and Nausea Only    Past Medical History:  Diagnosis Date  . Allergy   . Asthma   .  Heart murmur   . Hypertension   . SVT (supraventricular tachycardia) ([place])    last time in 06/2017   Social History   Socioeconomic History  . Marital status: Married    Spouse name: Not on file  . Number of children: Not on file  . Years of education: Not on file  . Highest  education level: Not on file  Occupational History  . Not on file  Social Needs  . Financial resource strain: Not on file  . Food insecurity:    Worry: Not on file    Inability: Not on file  . Transportation needs:    Medical: Not on file    Non-medical: Not on file  Tobacco Use  . Smoking status: Never Smoker  . Smokeless tobacco: Never Used  Substance and Sexual Activity  . Alcohol use: Yes    Frequency: Never    Comment: rare  . Drug use: No  . Sexual activity: Not on file  Lifestyle  . Physical activity:    Days per week: Not on file    Minutes per session: Not on file  . Stress: Not on file  Relationships  . Social connections:    Talks on phone: Not on file    Gets together: Not on file    Attends religious service: Not on file    Active member of club or organization: Not on file    Attends meetings of clubs or organizations: Not on file    Relationship status: Not on file  Other Topics Concern  . Not on file  Social History Narrative  . Not on file         Vanessa Kick, MD 08/13/18 [phone]

## 2018-09-19 ENCOUNTER — Telehealth: Payer: Self-pay | Admitting: Cardiovascular Disease

## 2018-09-19 NOTE — Telephone Encounter (Signed)
New message     LMOM to call and schedule virtual annual follow up with Dr Johnsie Cancel or Cecilie Kicks, NP.

## 2018-09-27 ENCOUNTER — Ambulatory Visit: Payer: 59

## 2018-11-03 ENCOUNTER — Other Ambulatory Visit: Payer: Self-pay | Admitting: Obstetrics and Gynecology

## 2018-11-03 DIAGNOSIS — R928 Other abnormal and inconclusive findings on diagnostic imaging of breast: Secondary | ICD-10-CM

## 2018-11-10 ENCOUNTER — Other Ambulatory Visit: Payer: 59

## 2018-11-15 ENCOUNTER — Ambulatory Visit
Admission: RE | Admit: 2018-11-15 | Discharge: 2018-11-15 | Disposition: A | Discharge status: Home / Self Care | Payer: 59 | Source: Ambulatory Visit | Attending: Obstetrics and Gynecology | Admitting: Obstetrics and Gynecology

## 2018-11-15 ENCOUNTER — Other Ambulatory Visit: Payer: Self-pay

## 2018-11-15 DIAGNOSIS — R928 Other abnormal and inconclusive findings on diagnostic imaging of breast: Secondary | ICD-10-CM

## 2019-04-10 ENCOUNTER — Other Ambulatory Visit: Payer: Self-pay | Admitting: *Deleted

## 2019-04-10 DIAGNOSIS — Z20822 Contact with and (suspected) exposure to covid-19: Secondary | ICD-10-CM

## 2019-04-12 LAB — NOVEL CORONAVIRUS, NAA: SARS-CoV-2, NAA: NOT DETECTED

## 2019-04-26 ENCOUNTER — Telehealth: Payer: Self-pay | Admitting: Oncology

## 2019-04-26 NOTE — Telephone Encounter (Signed)
Amber Rowe returned my call to schedule an appt for the high risk breast clinic. A new patient appt has been scheduled for her to see Dr. Jana Hakim on 12/17 at 4pm. She's been made aware to arrive 15 minutes early.

## 2019-05-10 ENCOUNTER — Encounter: Payer: Self-pay | Admitting: Oncology

## 2019-05-10 NOTE — Progress Notes (Signed)
[place]  [phone] [phone]     ID: Amber Rowe DOB: 02-Mar-1966  MR#: [id number]  WIO#:[id number]  Patient Care Team: Marius Ditch, MD as PCP - General (Internal Medicine) Olga Millers, MD as Consulting Physician (Obstetrics and Gynecology) Jerrell Belfast, MD as Consulting Physician (Otolaryngology) Josue Hector, MD as Consulting Physician (Cardiology) Thornton Park, MD as Consulting Physician (Gastroenterology) Hajime Asfaw, Virgie Dad, MD as Consulting Physician (Oncology) Gaynelle Arabian, MD as Consulting Physician (Orthopedic Surgery) Chauncey Cruel, MD OTHER MD:  CHIEF COMPLAINT: Breast and ovarian cancer high risk  CURRENT TREATMENT: To start tamoxifen; continue intensified screening   HISTORY OF CURRENT ILLNESS: Amber Rowe has a significant family history of breast and ovarian cancer, detailed in the family history below. Her mother was diagnosed with breast cancer at age 22 and with what may have been ovarian cancer at age 67.  The ovarian cancer however may have been metastatic breast cancer.  A maternal aunt was diagnosed with breast cancer at age 69 and a maternal great grandmother may have had ovarian cancer.    A paternal cousin was diagnosed with breast cancer at age 61.   Amber Rowe underwent [facility] genetic counseling on 01/23/2019. 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  She has been followed with intensified screening under Dr. Excell Seltzer, who recently retired. Her most recent breast MRI was performed on 02/11/2018 and showed: breast composition C; no evidence of malignancy; scattered small benign cysts again noted.  Her most recent mammogram was performed in 10/2018 and showed a possible abnormality in the right breast. She underwent right diagnostic mammography with tomography and right breast ultrasonography at [facility] on 11/15/2018  showing: breast density category C; two benign-appearing adjacent cysts, the larger measuring 7 mm, in the right breast at 6:30; no evidence of malignancy.  The patient's subsequent history is as detailed below.   INTERVAL HISTORY: Amber Rowe was evaluated in the high risk breast cancer clinic on 05/11/2019.  REVIEW OF SYSTEMS: Amber Rowe reports multiple symptoms consistent with menopause including hot flashes, insomnia and weight gain.  She has bursitis of the right hip which is followed by Dr. Reynaldo Minium.  She also has some scattered skin lesions consistent with mild impetigo and also some lesions in the vulvar area which she says are similar.  . A detailed review of systems was otherwise entirely negative.   PAST MEDICAL HISTORY: Past Medical History:  Diagnosis Date  . Allergy   . Asthma   . Depression   . Heart murmur   . Hypertension   . SVT (supraventricular tachycardia) ([place])    last time in 06/2017  Possible mesenteritis Lung lesion in lingula noted 2017  PAST SURGICAL HISTORY: Past Surgical History:  Procedure Laterality Date  . ABDOMINAL HYSTERECTOMY  04/2010  . NASAL SINUS SURGERY  2005    FAMILY HISTORY: Family History  Problem Relation Age of Onset  . Cancer Mother        breast  . Breast cancer Mother 12  . Ovarian cancer Mother 80  . Heart disease Father   . Bipolar disorder Father   . Cancer Maternal Grandmother        skin  . Breast cancer Maternal Aunt 60  . Breast cancer Cousin 39  . Diabetes Sister   . Leukemia Brother    As of December 2020 patient's father is 48 years old.  He had 1 sister, with no history of  cancer.  However that sister had a daughter diagnosed with cancer in her early 35s, and then later with a second breast cancer.  The patient's father also had 1 brother with no history of cancer.  His parents, the patient's paternal grandparents had no history of cancer.  As of December 2020 the patient's mother is 61 years old.  She had breast  cancer at age 21, and then at age 83 she was found to have significant peritoneal disease involving the ovaries and pelvis.  Initially this was felt to be ovarian cancer but later the patient was told it was breast cancer metastatic to the pelvis and she was treated with anastrozole.  That was 13 years ago and the patient is still alive so it is unlikely to have been ovarian cancer.  The patient's mother had 1 sister who was diagnosed with breast cancer late in life.  She had no brothers.  The patient's mother's grandmother had what may have been ovarian cancer ("they opened her up and there was cancer everywhere").   The patient herself has 2 sisters, with no history of cancer.  She has a half brother (father's side) 63 years old as of December 2020, with a history of acute lymphoid leukemia, and unfortunately with cardiomyopathy secondary to the treatment for the leukemia necessitating a cardiac transplant.   GYNECOLOGIC HISTORY:  No LMP recorded. Patient has had a hysterectomy. Menarche: 53 years old Age at first live birth: 53 years old [place] P 1 Contraceptive: Briefly on oral contraceptives, with no side effects. HRT no  Hysterectomy? Yes, 04/2009 BSO?  Status post right salpingo-oophorectomy. 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  Her husband Amber Rowe is a Sales promotion account executive.  Their son Amber Rowe died suddenly during football training in 2016.  He was 14 at the time.  She showed me his photo, a strapping young man who looks serious and competent.  The patient tells me she has a strong faith.    ADVANCED DIRECTIVES: In the absence of any documentation to the contrary, the patient's spouse is her HCPOA.    HEALTH MAINTENANCE: Social History   Tobacco Use  . Smoking status: Never  Smoker  . Smokeless tobacco: Never Used  Substance Use Topics  . Alcohol use: Yes    Comment: rare  . Drug use: No     Colonoscopy: 04/2018 (Dr. Tarri Glenn), repeat in 10 years  PAP: 05/2015, negative; status post hysterectomy  Bone density: n/a (age)   Allergies  Allergen Reactions  . Latex Rash    swelling  . Erythromycin Base Diarrhea and Nausea Only    Current Outpatient Medications  Medication Sig Dispense Refill  . acetaminophen (TYLENOL) 500 MG tablet Take 500 mg by mouth. Take 2 pills prn    . albuterol (PROVENTIL HFA;VENTOLIN HFA) 108 (90 BASE) MCG/ACT inhaler Inhale 2 puffs into the lungs every 6 (six) hours as needed for wheezing.    Marland Kitchen ALPRAZolam (XANAX) 0.5 MG tablet Take 0.5 mg by mouth at bedtime as needed for sleep.    . cholecalciferol (VITAMIN D3) 25 MCG (1000 UT) tablet Take 1,000 Units by mouth daily.    . Doxylamine Succinate, Sleep, (UNISOM PO) Take 0.5 tablets by mouth at bedtime.    . famotidine (PEPCID) 40 MG tablet Take 20 mg by mouth daily.     Marland Kitchen  hydrocortisone 2.5 % cream Apply 1 application topically as needed (dry skin).     Marland Kitchen ibuprofen (ADVIL,MOTRIN) 200 MG tablet Take 200 mg by mouth. Take 4 pills prn    . lisinopril (PRINIVIL,ZESTRIL) 10 MG tablet Take 5 mg by mouth daily.     Marland Kitchen loratadine (CLARITIN) 10 MG tablet Take 10 mg by mouth daily. Take 1/2 pill daily    . mometasone (ASMANEX) 220 MCG/INH inhaler Inhale 1 puff into the lungs daily.     . Multiple Vitamin (MULTIVITAMIN) capsule Take 1 capsule by mouth daily.    . Probiotic Product (SOLUBLE FIBER/PROBIOTICS PO) Take by mouth daily.    . propranolol (INDERAL) 10 MG tablet Take 1 tablet (10 mg total) by mouth as needed (palpitations). (Patient taking differently: Take 10 mg by mouth as needed (palpitations). Pt states she's never had to take this medication) 30 tablet 3  . rOPINIRole (REQUIP) 0.25 MG tablet Take 0.25 mg by mouth 3 (three) times daily. Pt unsure of the dose    . SUMAtriptan (IMITREX)  50 MG tablet Take 12.5 mg by mouth every 2 (two) hours as needed for migraine or headache. May repeat in 2 hours if headache persists or recurs.     . TURMERIC CURCUMIN PO Take by mouth daily.    Marland Kitchen VITAMIN E PO Take 400 Units by mouth daily.     Marland Kitchen escitalopram (LEXAPRO) 5 MG tablet Take 2.5 mg by mouth daily.      Current Facility-Administered Medications  Medication Dose Route Frequency Provider Last Rate Last Admin  . 0.9 %  sodium chloride infusion  500 mL Intravenous Once Thornton Park, MD        OBJECTIVE: Middle-aged white woman who appears stated age  62:   05/11/19 1554  BP: 126/65  Pulse: 85  Resp: 20  Temp: 98.3 F (36.8 C)  SpO2: 100%     Body mass index is 34.82 kg/m.   Wt Readings from Last 3 Encounters:  05/11/19 222 lb 4.8 oz (100.8 kg)  05/02/18 199 lb (90.3 kg)  04/15/18 199 lb 6.4 oz (90.4 kg)      ECOG FS:1 - Symptomatic but completely ambulatory  Ocular: Sclerae unicteric, EOMs intact Ear-nose-throat: Wearing a mask Lymphatic: No cervical or supraclavicular adenopathy Lungs no rales or rhonchi Heart regular rate and rhythm Abd soft, nontender, positive bowel sounds MSK no focal spinal tenderness; currently walks with a limp secondary to documented right hip bursitis Neuro: non-focal, well-oriented, positive affect Breasts: No masses palpated in either breast.  No skin or nipple changes of concern.  Both axillae are benign.   LAB RESULTS:  CMP     Component Value Date/Time   NA 141 06/29/2017 1446   K 4.4 06/29/2017 1446   CL 106 06/29/2017 1446   CO2 23 06/29/2017 1446   GLUCOSE 113 (H) 06/29/2017 1446   BUN 18 06/29/2017 1446   CREATININE 0.95 06/29/2017 1446   CALCIUM 9.7 06/29/2017 1446   PROT 7.3 11/25/2012 1214   ALBUMIN 3.7 11/25/2012 1214   AST 15 11/25/2012 1214   ALT 12 11/25/2012 1214   ALKPHOS 80 11/25/2012 1214   BILITOT 0.4 11/25/2012 1214   GFRNONAA >60 06/29/2017 1446   GFRAA >60 06/29/2017 1446    No results  found for: TOTALPROTELP, ALBUMINELP, A1GS, A2GS, BETS, BETA2SER, GAMS, MSPIKE, SPEI  Lab Results  Component Value Date   WBC 13.0 (H) 06/29/2017   HGB 14.5 06/29/2017   HCT 44.8 06/29/2017  MCV 89.8 06/29/2017   PLT 305 06/29/2017    No results found for: LABCA2  No components found for: QHUTML465  No results for input(s): INR in the last 168 hours.  No results found for: LABCA2  No results found for: KPT465  No results found for: KCL275  No results found for: TZG017  No results found for: CA2729  No components found for: HGQUANT  No results found for: CEA1 / No results found for: CEA1   No results found for: AFPTUMOR  No results found for: CHROMOGRNA  No results found for: KPAFRELGTCHN, LAMBDASER, KAPLAMBRATIO (kappa/lambda light chains)  No results found for: HGBA, HGBA2QUANT, HGBFQUANT, HGBSQUAN (Hemoglobinopathy evaluation)   No results found for: LDH  No results found for: IRON, TIBC, IRONPCTSAT (Iron and TIBC)  No results found for: FERRITIN  Urinalysis No results found for: COLORURINE, APPEARANCEUR, LABSPEC, PHURINE, GLUCOSEU, HGBUR, BILIRUBINUR, KETONESUR, PROTEINUR, UROBILINOGEN, NITRITE, LEUKOCYTESUR   STUDIES: Tyrer Cusick results:    ELIGIBLE FOR AVAILABLE RESEARCH PROTOCOL: no  ASSESSMENT: 53 y.o. [place], [place] woman at high risk for both breast and ovarian cancer.  (1) breast cancer risk greater than 30% lifetime on [employer] model  (a) intensified screening: per [facility]  (b) tamoxifen started December 2020  (2) ovarian cancer risk:  (a) mother and maternal great-grandmother may have had ovarian cancer  (b) ATM variant of unknown significance  (3) lingular lesion noted on CT scan November 2017  (a) follow-up CT scan pending  PLAN: I spent approximately 60 minutes face to face with Amber Rowe with more than 50% of that time spent in counseling and coordination of care.  We discussed her family history in detail  and note that there is significant doubt whether her mother actually had ovarian cancer versus more likely a lobular breast cancer with metastases to the pelvis.  In addition a maternal great grandmother may have had ovarian cancer.  We reviewed the genetics results.  Amber Rowe understands that variants of uncertain significance are by definition not actionable.  In the context of her family history however it pushes me to recommend left salpingo-oophorectomy especially now that she is demonstrably postmenopausal.  She will discuss this further with Dr. Ouida Sills.  With regards to the breast cancer risk, she clearly warrants intensified screening.  This was operationalized by Dr. Excell Seltzer previously.  The patient plans to establish herself with Dr. Donne Hazel at [facility] and he may choose to continue the yearly mammography alternating with yearly breast MRI, 6 months apart.  She also needs bi-annual breast exams which she can most easily obtaine through Dr. Donne Hazel and Dr. Ouida Sills.  In addition to intensified screening, she can decrease her risk of developing breast cancer by taking antiestrogens.  In her case I think tamoxifen would be a particularly good choice since she is status post hysterectomy and since she took oral contraceptives, even if only briefly, with no clotting complications..  We discussed the possible toxicities side effects and complications of tamoxifen and I have gone ahead and placed the prescription in for her.  She will let us know if she has any difficulties with this medication.  Otherwise the plan will be to continue it for 5 years  She already has a dermatologic appointment for her skin and vulvar lesions.  I am starting her on doxycycline daily between now and then which will at a minimum give her information on whether the spots are antiiotic responsive or not.  Also Dr. Tonette Bihari office as already  placed an order for repeat CT scan of the chest to follow-up on  the lingular lesion noted 3 years ago.  Tentatively I have made a return appointment for Amber Rowe here in April but she has my number and if everything is going well at that time and she has adequate follow-up through her other physicians then we will cancel that visit   Chauncey Cruel, MD   05/11/2019 4:58 PM Medical Oncology and Hematology [facility] [place], [place] [address] Tel. [phone]    Fax. [phone]   This document serves as a record of services personally performed by Lurline Del, MD. It was created on his behalf by Wilburn Mylar, a trained medical scribe. The creation of this record is based on the scribe's personal observations and the provider's statements to them.   I, Lurline Del MD, have reviewed the above documentation for accuracy and completeness, and I agree with the above.

## 2019-05-11 ENCOUNTER — Other Ambulatory Visit: Payer: Self-pay

## 2019-05-11 ENCOUNTER — Inpatient Hospital Stay: Payer: 59 | Attending: Oncology | Admitting: Oncology

## 2019-05-11 DIAGNOSIS — Z803 Family history of malignant neoplasm of breast: Secondary | ICD-10-CM | POA: Diagnosis not present

## 2019-05-11 DIAGNOSIS — Z8041 Family history of malignant neoplasm of ovary: Secondary | ICD-10-CM | POA: Diagnosis not present

## 2019-05-11 DIAGNOSIS — Z90722 Acquired absence of ovaries, bilateral: Secondary | ICD-10-CM | POA: Diagnosis not present

## 2019-05-11 DIAGNOSIS — Z1239 Encounter for other screening for malignant neoplasm of breast: Secondary | ICD-10-CM | POA: Diagnosis not present

## 2019-05-11 DIAGNOSIS — Z1502 Genetic susceptibility to malignant neoplasm of ovary: Secondary | ICD-10-CM | POA: Diagnosis present

## 2019-05-11 DIAGNOSIS — Z9189 Other specified personal risk factors, not elsewhere classified: Secondary | ICD-10-CM | POA: Diagnosis not present

## 2019-05-11 DIAGNOSIS — Z1501 Genetic susceptibility to malignant neoplasm of breast: Secondary | ICD-10-CM | POA: Insufficient documentation

## 2019-05-11 MED ORDER — TAMOXIFEN CITRATE 20 MG PO TABS: 20.0000 mg | ORAL_TABLET | Freq: Every day | ORAL | 12 refills | Status: AC

## 2019-05-11 MED ORDER — DOXYCYCLINE HYCLATE 100 MG PO TABS: 100.0000 mg | ORAL_TABLET | Freq: Every day | ORAL | 0 refills | Status: DC

## 2019-05-12 ENCOUNTER — Telehealth: Payer: Self-pay | Admitting: Oncology

## 2019-05-12 NOTE — Telephone Encounter (Signed)
I talk with patient regarding schedule  

## 2019-05-23 ENCOUNTER — Encounter: Payer: Self-pay | Admitting: Oncology

## 2019-05-30 ENCOUNTER — Other Ambulatory Visit: Payer: Self-pay | Admitting: General Surgery

## 2019-05-30 ENCOUNTER — Other Ambulatory Visit: Payer: Self-pay | Admitting: Oncology

## 2019-05-30 DIAGNOSIS — Z1239 Encounter for other screening for malignant neoplasm of breast: Secondary | ICD-10-CM

## 2019-06-01 NOTE — Progress Notes (Signed)
Opened in error

## 2019-06-02 ENCOUNTER — Telehealth: Payer: Self-pay | Admitting: *Deleted

## 2019-06-02 IMAGING — MR MR BILATERAL BREAST WITHOUT AND WITH CONTRAST
8 of 12 series · 30 of 48 positions shown · IV contrast (Multihance)
Comparison: 08/03/2014 and 05/22/2011 MRs. Previous mammograms.

CLINICAL DATA: 51-year-old female for high risk screening breast
MRI. Strong family history of breast cancer.

LABS:  Creatinine was obtained on site at [HOSPITAL] at [REDACTED] [HOSPITAL].
Results: Creatinine 0.9 mg/dL.
EXAM:
BILATERAL BREAST MRI WITH AND WITHOUT CONTRAST
TECHNIQUE: Multiplanar, multisequence MR images of both breasts were obtained
prior to and following the intravenous administration of 20 ml of
MultiHance.

[Series 2: t2_tirm_tra ipat (a-p) · axial · 3.0mm · 0.70mm/px · 1 of 63 slices shown]
[im 1/63]
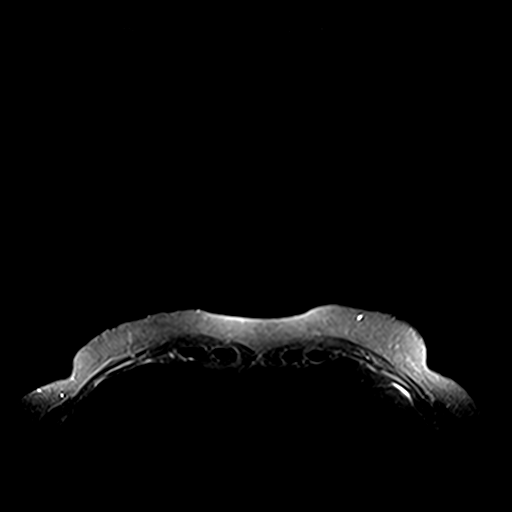

[Series 3: fl3d pre-cm no · axial · non-contrast · 0.9mm · 0.94mm/px · z∈[-58,+129]mm · 5 of 208 slices shown]
[im 1/208]
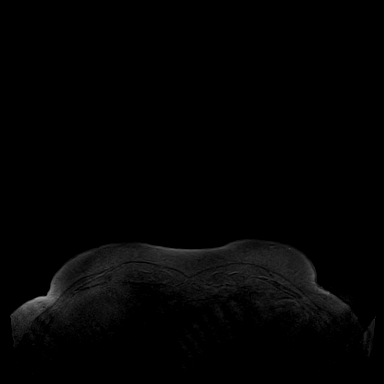
[im 52/208]
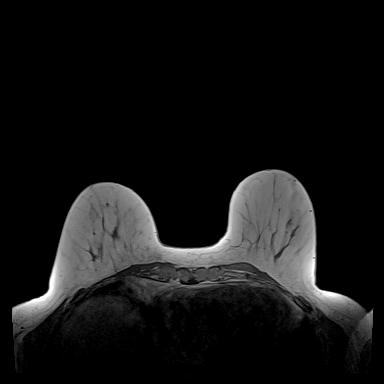
[im 104/208]
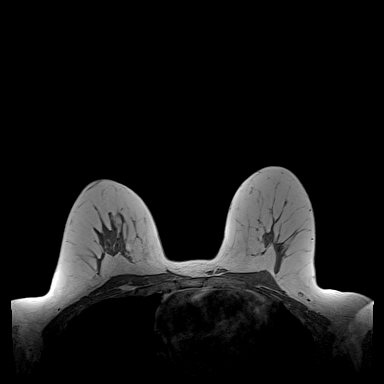
[im 156/208]
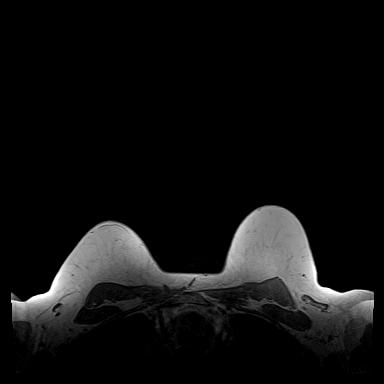
[im 208/208]
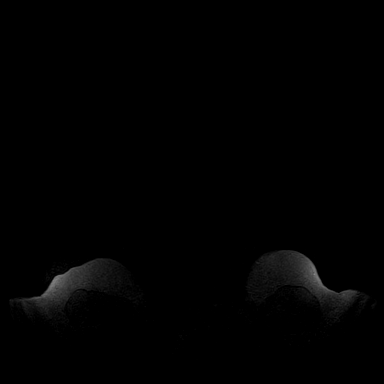

[Series 4: fl3d pre-cm · axial · non-contrast · 0.9mm · 0.87mm/px · z∈[-58,+129]mm · 5 of 208 slices shown]
[im 1/208]
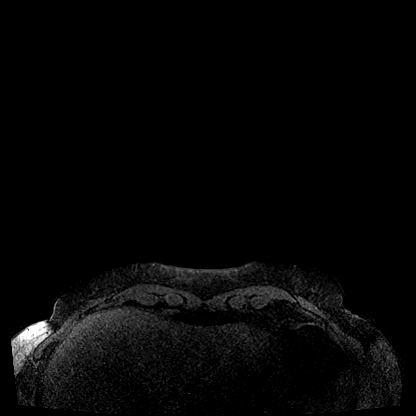
[im 52/208]
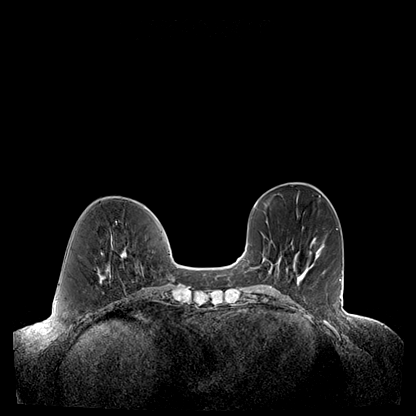
[im 104/208]
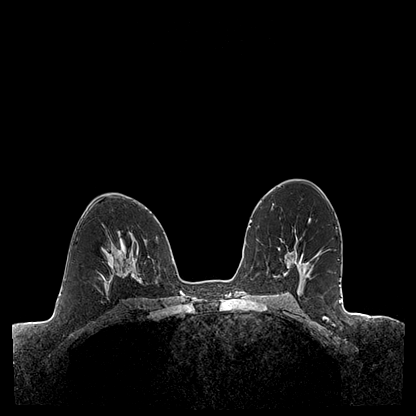
[im 156/208]
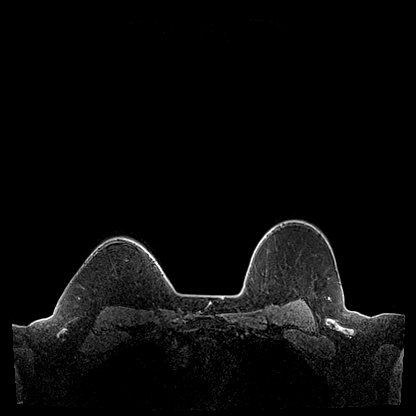
[im 208/208]
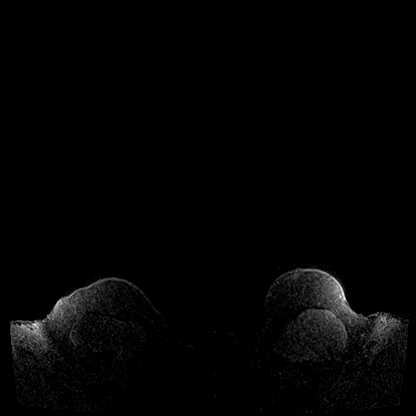

[Series 5: fl3d post-cm 20 · axial · 0.9mm · 0.87mm/px · z∈[-58,+129]mm · 5 of 208 slices shown (1 of 3)]
[im 1/208]
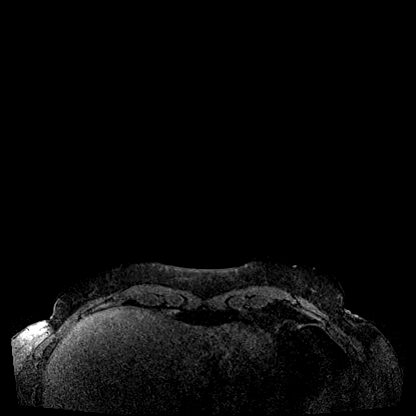
[im 52/208]
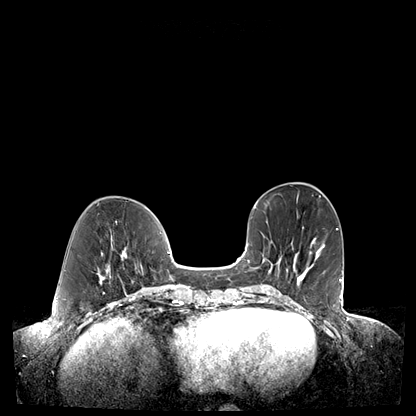
[im 104/208]
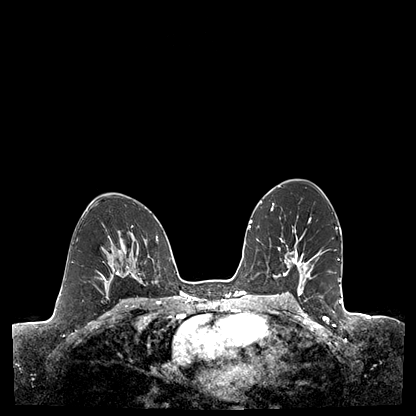
[im 156/208]
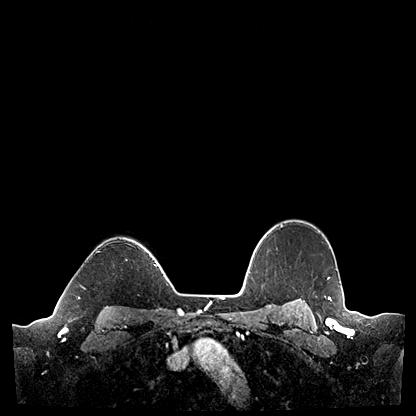
[im 208/208]
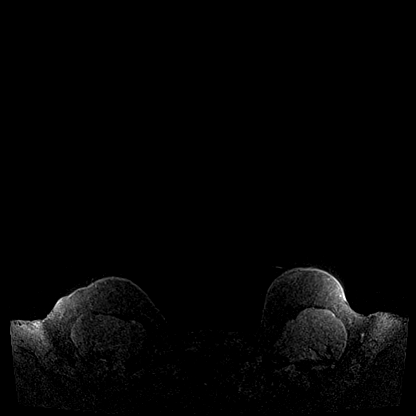

[Series 6: fl3d post-cm 20 · axial · 0.9mm · 0.87mm/px · z∈[-58,+129]mm · 5 of 208 slices shown (2 of 3)]
[im 1/208]
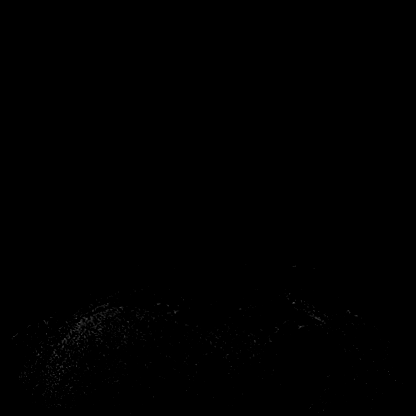
[im 52/208]
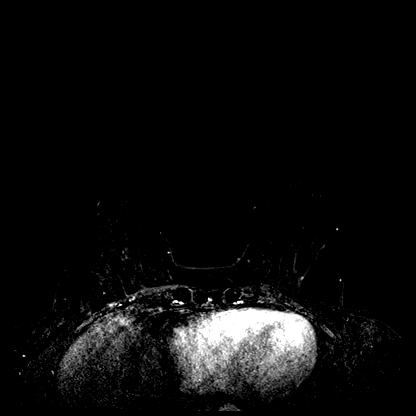
[im 104/208]
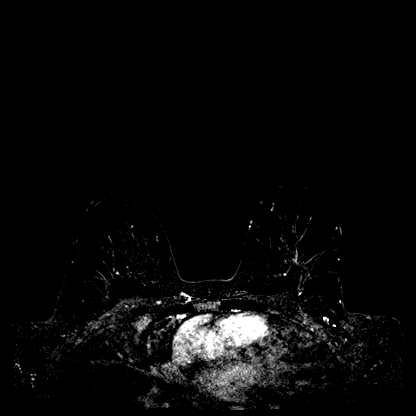
[im 156/208]
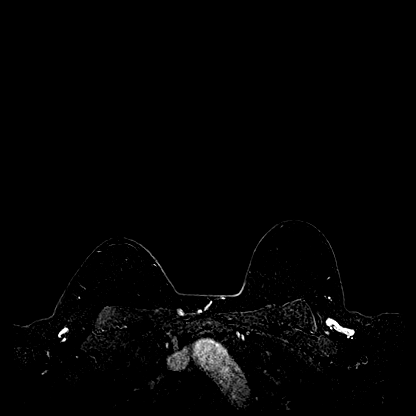
[im 208/208]
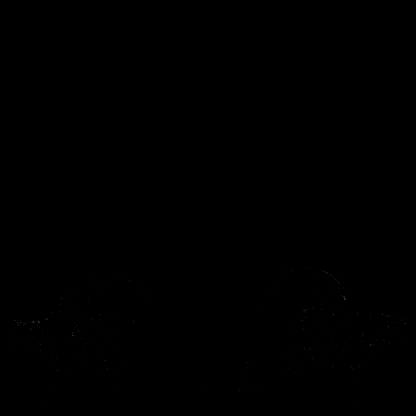

[Series 7: fl3d post-cm 20 · axial · 187.2mm · 0.87mm/px · 1 of 1 slices shown (3 of 3)]
[im 1/1]
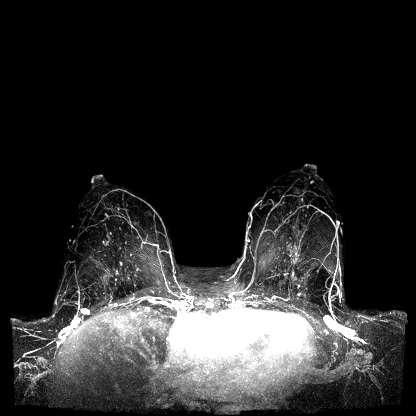

[Series 8: fl3d post-cm 3min · axial · 0.9mm · 0.87mm/px · z∈[-58,+129]mm · 6 of 208 slices shown]
[im 1/208]
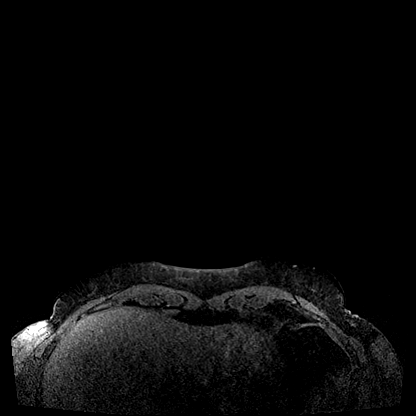
[im 42/208]
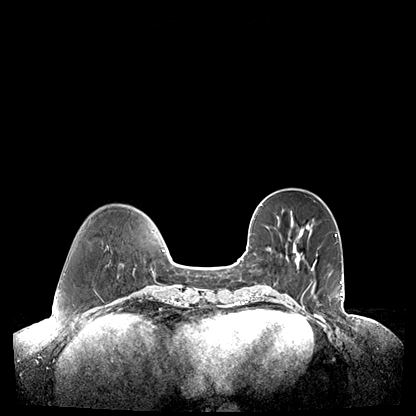
[im 83/208]
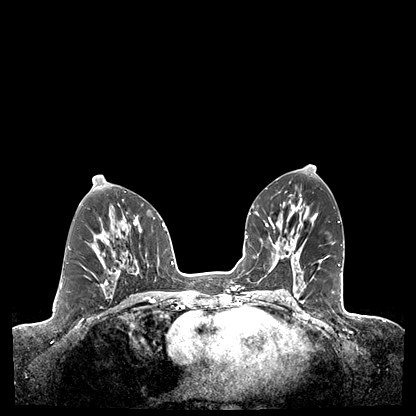
[im 125/208]
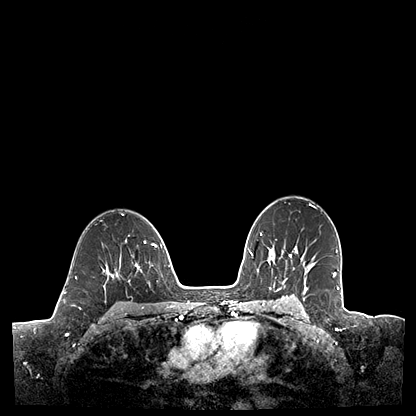
[im 166/208]
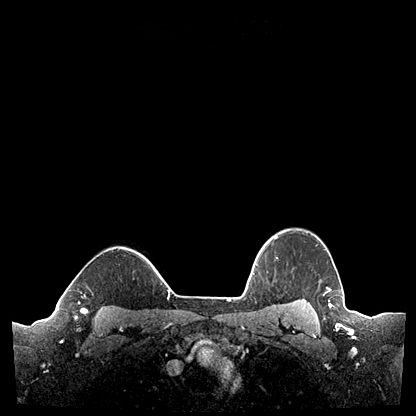
[im 208/208]
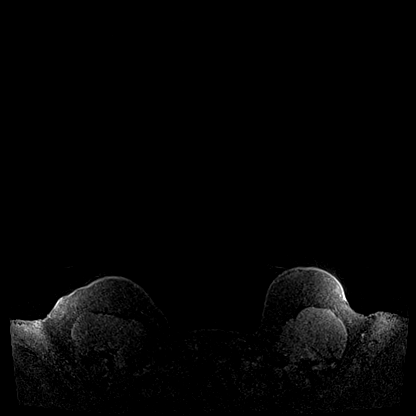

[Series 9: fl3d post-cm 3min_sub · axial · 0.9mm · 0.87mm/px · z∈[-58,-21]mm · 2 of 208 slices shown]
[im 1/208]
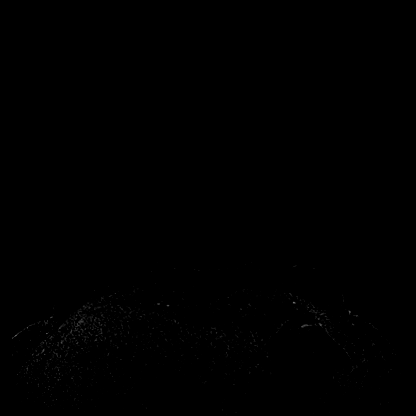
[im 42/208]
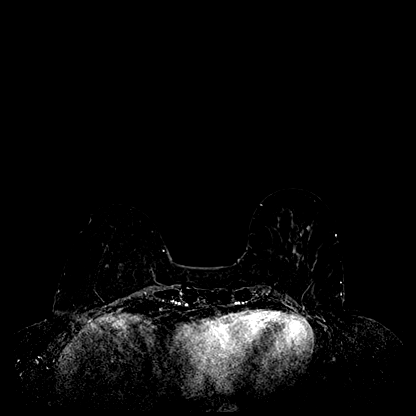

[30 of 48 positions shown; findings below may reference images not displayed]

THREE-DIMENSIONAL MR IMAGE RENDERING ON INDEPENDENT WORKSTATION:

Three-dimensional MR images were rendered by post-processing of the
original MR data on an independent workstation. The
three-dimensional MR images were interpreted, and findings are
reported in the following complete MRI report for this study. Three
dimensional images were evaluated at the independent [employer]
workstation
FINDINGS: Breast composition: c. Heterogeneous fibroglandular tissue.

Background parenchymal enhancement: Mild

Right breast: No suspicious mass or abnormal enhancement. Multiple
small cysts are again noted.

Left breast: No suspicious mass or abnormal enhancement. Multiple
small cysts are again noted.

Lymph nodes: No abnormal appearing lymph nodes.

Ancillary findings:  None.
IMPRESSION: No MR evidence of breast malignancy.

RECOMMENDATION:
Bilateral screening mammograms in August 2017 to resume annual
schedule.

The American Cancer Society recommends annual bilateral screening
breast MRI and annual bilateral screening mammograms if the
patient's lifetime risk of developing breast cancer exceeds 20%.

BI-RADS CATEGORY  2: Benign.

## 2019-06-05 ENCOUNTER — Other Ambulatory Visit: Payer: Self-pay | Admitting: General Surgery

## 2019-06-05 ENCOUNTER — Other Ambulatory Visit: Payer: Self-pay | Admitting: Orthopedic Surgery

## 2019-06-05 DIAGNOSIS — M25552 Pain in left hip: Secondary | ICD-10-CM

## 2019-06-05 DIAGNOSIS — Z1239 Encounter for other screening for malignant neoplasm of breast: Secondary | ICD-10-CM

## 2019-06-05 NOTE — Telephone Encounter (Signed)
error 

## 2019-06-06 ENCOUNTER — Other Ambulatory Visit: Payer: Self-pay | Admitting: Physician Assistant

## 2019-06-06 DIAGNOSIS — R918 Other nonspecific abnormal finding of lung field: Secondary | ICD-10-CM

## 2019-06-13 ENCOUNTER — Ambulatory Visit: Payer: 59 | Admitting: Gynecologic Oncology

## 2019-06-13 ENCOUNTER — Other Ambulatory Visit: Payer: Self-pay

## 2019-06-14 ENCOUNTER — Encounter: Payer: Self-pay | Admitting: Oncology

## 2019-06-21 ENCOUNTER — Other Ambulatory Visit: Payer: Self-pay

## 2019-06-22 ENCOUNTER — Inpatient Hospital Stay: Payer: 59 | Attending: Oncology | Admitting: Gynecologic Oncology

## 2019-06-22 ENCOUNTER — Encounter: Payer: Self-pay | Admitting: Gynecologic Oncology

## 2019-06-22 ENCOUNTER — Other Ambulatory Visit: Payer: Self-pay

## 2019-06-22 VITALS — BP 131/67 | HR 71 | Temp 97.5°F | Resp 16 | Ht 67.0 in | Wt 221.0 lb

## 2019-06-22 DIAGNOSIS — I10 Essential (primary) hypertension: Secondary | ICD-10-CM | POA: Insufficient documentation

## 2019-06-22 DIAGNOSIS — Z90722 Acquired absence of ovaries, bilateral: Secondary | ICD-10-CM

## 2019-06-22 DIAGNOSIS — N809 Endometriosis, unspecified: Secondary | ICD-10-CM | POA: Insufficient documentation

## 2019-06-22 DIAGNOSIS — I471 Supraventricular tachycardia: Secondary | ICD-10-CM | POA: Diagnosis not present

## 2019-06-22 DIAGNOSIS — Z803 Family history of malignant neoplasm of breast: Secondary | ICD-10-CM | POA: Diagnosis present

## 2019-06-22 DIAGNOSIS — F329 Major depressive disorder, single episode, unspecified: Secondary | ICD-10-CM | POA: Insufficient documentation

## 2019-06-22 DIAGNOSIS — Z90710 Acquired absence of both cervix and uterus: Secondary | ICD-10-CM | POA: Diagnosis not present

## 2019-06-22 DIAGNOSIS — Z1502 Genetic susceptibility to malignant neoplasm of ovary: Secondary | ICD-10-CM | POA: Insufficient documentation

## 2019-06-22 DIAGNOSIS — Z90721 Acquired absence of ovaries, unilateral: Secondary | ICD-10-CM | POA: Diagnosis not present

## 2019-06-22 DIAGNOSIS — R011 Cardiac murmur, unspecified: Secondary | ICD-10-CM | POA: Diagnosis not present

## 2019-06-22 DIAGNOSIS — Z1589 Genetic susceptibility to other disease: Secondary | ICD-10-CM

## 2019-06-22 DIAGNOSIS — Z79899 Other long term (current) drug therapy: Secondary | ICD-10-CM | POA: Insufficient documentation

## 2019-06-22 DIAGNOSIS — Z1501 Genetic susceptibility to malignant neoplasm of breast: Secondary | ICD-10-CM | POA: Diagnosis not present

## 2019-06-22 DIAGNOSIS — Z7951 Long term (current) use of inhaled steroids: Secondary | ICD-10-CM | POA: Diagnosis not present

## 2019-06-22 DIAGNOSIS — Z791 Long term (current) use of non-steroidal anti-inflammatories (NSAID): Secondary | ICD-10-CM | POA: Diagnosis not present

## 2019-06-22 NOTE — Progress Notes (Signed)
Consult Note: Gyn-Onc  Consult was requested by Dr. Ouida Sills for the evaluation of Amber Rowe 54 y.o. female  CC:  Chief Complaint  Patient presents with  . High risk of ovarian cancer    Assessment/Plan:  [name]  is a 54 y.o.  year old with a history of endometriosis and a new diagnosis of an ATM mutation of undetermined significance. Her mother had recurrent metastatic breast cancer, however she does not have a history of ovarian cancer.   I discussed with the patient that ATM mutations have an unclear association with her ovarian cancer and it is not the current recommendation to perform routinely a risk reducing salpingo-oophorectomy particularly for women under the age of 30.  I explained to Amber Rowe that oophorectomy younger than the age of 72 is associated with increased all cause mortality, mostly secondary to cardiovascular disease exacerbation.  However oophorectomy is associated with decreased risk of breast cancer recurrence in patients with known breast cancer history, however this does not apply to [place].  I discussed with Amber Rowe that she may have increased surgical risk due to her history of endometriosis, that these include risks of damage to adjacent organs conversion to laparotomy.  I explained that surgery would likely not be prohibitive but is certainly high risk than a patient without her history of endometriosis and prior surgery.  I offered Amber Rowe to either proceed with surgery at this point time for an oophorectomy for what ever potential benefit it may offer, though I feel this would likely be low.  Alternatively she could continue annual surveillance.  If the ovary is not visualized on ultrasound this is a reassuring finding.  If she desired definitive removal of the ovary at any time understanding the risks associated with this, I would be happy to proceed with the surgery.  Surgery would likely involve a robotic assisted unilateral  salpingo-oophorectomy.  The patient will consider her options and notify us if she is interested in proceeding with surgery.   HPI: Amber Rowe is a very pleasant 54 year old woman who was seen in consultation at the request of Dr. Ouida Sills for evaluation of an ATM mutation of undetermined clinical significance.  The patient's family history is significant for a mother who had premenopausal breast cancer at age 54.  She was treated with surgery and chemotherapy.  Her tumor recurred approximately 20 years later in the peritoneal cavity and she had metastatic breast cancer affecting her ovaries, omentum, and other peritoneal surfaces.  She had multiple peritoneal recurrences in subsequent years.  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  The patient is clear that her mother never had a history of ovarian cancer and that the peritoneal disease she experienced was recurrent metastatic breast cancer.  This is in contrast to much of what is in the documented medical history in the electronic medical record of epic and her referring doctor's office where it states that her family history significant for ovarian cancer.  The patient who has medical education is very clear that this is not the case and this must have been misinterpreted during this history taking.  The patient underwent genetic assessment with [facility] on January 23, 2019.  This identified a variant of unknown significance in ATM.  The patient reported that her mother had previously undergone genetic testing that she feels was negative.  The patient was seen by Dr. Jana Hakim for evaluation of her potentially increased risk for breast cancer.  Dr. Jana Hakim recommended either wrist reducing surgery or close surveillance.  The patient opted for close surveillance.  Dr. Jana Hakim recommended removal of the ovary.  The patient's gynecologic history significant for history of endometriosis.  She had  1 prior vaginal delivery.  Her endometriosis cause cyclical pelvic pain.  This resulted in her undergoing a laparoscopic-assisted vaginal hysterectomy with right salpingo-oophorectomy and cauterization of endometriosis with Dr. Ouida Sills on May 01, 2009.  The procedure was uncomplicated.  Intraoperative findings were remarkable for a right endometriotic cyst, and a normal-appearing left ovary.  There were uterosacral ligament endometriosis lesions identified.  However based on the operative note there was not a frozen pelvis.  Final pathology was reviewed by me and revealed a benign uterus, right fallopian tube, and a benign hemorrhagic follicular cyst in the right ovary.  The patient reported undergoing menopausal symptoms beginning at age 88.  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  She lives with her husband.  Her son was deceased in childhood.  Current Meds:  Outpatient Encounter Medications as of 06/22/2019  Medication Sig  . acetaminophen (TYLENOL) 500 MG tablet Take 500 mg by mouth. Take 2 pills prn  . albuterol (PROVENTIL HFA;VENTOLIN HFA) 108 (90 BASE) MCG/ACT inhaler Inhale 2 puffs into the lungs every 6 (six) hours as needed for wheezing.  Marland Kitchen ALPRAZolam (XANAX) 0.5 MG tablet Take 0.5 mg by mouth at bedtime as needed for sleep.  . cephALEXin (KEFLEX) 500 MG capsule Take 500 mg by mouth 2 (two) times daily.  . Doxylamine Succinate, Sleep, (UNISOM PO) Take 0.5 tablets by mouth at bedtime.  . famotidine (PEPCID) 40 MG tablet Take 20 mg by mouth daily.   . hydrocortisone 2.5 % cream Apply 1 application topically as needed (dry skin).   Marland Kitchen ibuprofen (ADVIL,MOTRIN) 200 MG tablet Take 200 mg by mouth. Take 4 pills prn  . lisinopril (PRINIVIL,ZESTRIL) 10 MG tablet Take 5 mg by mouth daily.   Marland Kitchen loratadine (CLARITIN) 10 MG tablet Take 10 mg by mouth daily. Take  1/2 pill daily  . meloxicam (MOBIC) 15 MG tablet Take 15 mg by mouth daily.  . Multiple Vitamin (MULTIVITAMIN) capsule Take 1 capsule by mouth daily.  . Probiotic Product (SOLUBLE FIBER/PROBIOTICS PO) Take by mouth daily.  Marland Kitchen rOPINIRole (REQUIP) 0.5 MG tablet Take 1 mg by mouth at bedtime.  . SUMAtriptan (IMITREX) 50 MG tablet Take 12.5 mg by mouth every 2 (two) hours as needed for migraine or headache. May repeat in 2 hours if headache persists or recurs.   . tamoxifen (NOLVADEX) 20 MG tablet Take 20 mg by mouth daily.  Marland Kitchen escitalopram (LEXAPRO) 5 MG tablet Take 2.5 mg by mouth daily.   . mometasone (ASMANEX) 220 MCG/INH inhaler Inhale 1 puff into the lungs daily.   . propranolol (INDERAL) 10 MG tablet Take 1 tablet (10 mg total) by mouth as needed (palpitations). (Patient not taking: Reported on 06/22/2019)  . tretinoin (RETIN-A) 0.025 % cream tretinoin 0.025 % topical cream  APPLY PEA SIZE AMOUNT TO FACE NIGHTLY  . [DISCONTINUED] cholecalciferol (VITAMIN D3) 25 MCG (1000 UT) tablet Take 1,000 Units by mouth daily.  . [DISCONTINUED] doxycycline (VIBRA-TABS) 100 MG tablet Take 1 tablet (100 mg total) by mouth daily.  . [DISCONTINUED] gabapentin (NEURONTIN) 100 MG capsule Take 100 mg by mouth daily.  . [DISCONTINUED] rOPINIRole (REQUIP) 0.25 MG tablet Take 0.25  mg by mouth 3 (three) times daily. Pt unsure of the dose  . [DISCONTINUED] TURMERIC CURCUMIN PO Take by mouth daily.  . [DISCONTINUED] VITAMIN E PO Take 400 Units by mouth daily.    Facility-Administered Encounter Medications as of 06/22/2019  Medication  . 0.9 %  sodium chloride infusion    Allergy:  Allergies  Allergen Reactions  . Latex Rash    swelling  . Erythromycin Base Diarrhea and Nausea Only    Social Hx:   Social History   Socioeconomic History  . Marital status: Married    Spouse name: Not on file  . Number of children: Not on file  . Years of education: Not on file  . Highest education level: Not on file   Occupational History  . Not on file  Tobacco Use  . Smoking status: Never Smoker  . Smokeless tobacco: Never Used  Substance and Sexual Activity  . Alcohol use: Yes    Comment: rare  . Drug use: No  . Sexual activity: Yes    Birth control/protection: Surgical  Other Topics Concern  . Not on file  Social History Narrative  . Not on file   Social Determinants of Health   Financial Resource Strain:   . Difficulty of Paying Living Expenses: Not on file  Food Insecurity:   . Worried About Charity fundraiser in the Last Year: Not on file  . Ran Out of Food in the Last Year: Not on file  Transportation Needs:   . Lack of Transportation (Medical): Not on file  . Lack of Transportation (Non-Medical): Not on file  Physical Activity:   . Days of Exercise per Week: Not on file  . Minutes of Exercise per Session: Not on file  Stress:   . Feeling of Stress : Not on file  Social Connections:   . Frequency of Communication with Friends and Family: Not on file  . Frequency of Social Gatherings with Friends and Family: Not on file  . Attends Religious Services: Not on file  . Active Member of Clubs or Organizations: Not on file  . Attends Archivist Meetings: Not on file  . Marital Status: Not on file  Intimate Partner Violence:   . Fear of Current or Ex-Partner: Not on file  . Emotionally Abused: Not on file  . Physically Abused: Not on file  . Sexually Abused: Not on file    Past Surgical Hx:  Past Surgical History:  Procedure Laterality Date  . ABDOMINAL HYSTERECTOMY  04/2010  . COLONOSCOPY  03/25/2018  . NASAL SINUS SURGERY  2005    Past Medical Hx:  Past Medical History:  Diagnosis Date  . Allergy   . Asthma   . Depression   . Heart murmur   . Hypertension   . SVT (supraventricular tachycardia) ([place])    last time in 06/2017    Past Gynecological History:  See above, hx of abnormal paps and vaginal dysplasia. No LMP recorded. Patient has had a  hysterectomy.  Family Hx:  Family History  Problem Relation Age of Onset  . Cancer Mother        breast  . Breast cancer Mother 52  . Ovarian cancer Mother 43  . Heart disease Father   . Bipolar disorder Father   . Cancer Maternal Grandmother        skin  . Breast cancer Maternal Aunt 60  . Breast cancer Cousin 9  . Diabetes Sister   . Leukemia  Brother     Review of Systems:  Constitutional  Feels well,   ENT Normal appearing ears and nares bilaterally Skin/Breast  No rash, sores, jaundice, itching, dryness Cardiovascular  No chest pain, shortness of breath, or edema  Pulmonary  No cough or wheeze.  Gastro Intestinal  No nausea, vomitting, or diarrhoea. No bright red blood per rectum, no abdominal pain, change in bowel movement, or constipation.  Genito Urinary  No frequency, urgency, dysuria, no bleeding. Musculo Skeletal  No myalgia, arthralgia, joint swelling or pain  Neurologic  No weakness, numbness, change in gait,  Psychology  No depression, anxiety, insomnia.   Vitals:  Blood pressure 131/67, pulse 71, temperature (!) 97.5 F (36.4 C), temperature source Temporal, resp. rate 16, height '5\' 7"'  (1.702 m), weight 221 lb (100.2 kg), SpO2 100 %.  Physical Exam: WD in NAD Neck  Supple NROM, without any enlargements.  Lymph Node Survey No cervical supraclavicular or inguinal adenopathy Cardiovascular  Pulse normal rate, regularity and rhythm. S1 and S2 normal.  Lungs  Clear to auscultation bilateraly, without wheezes/crackles/rhonchi. Good air movement.  Skin  No rash/lesions/breakdown  Psychiatry  Alert and oriented to person, place, and time  Abdomen  Normoactive bowel sounds, abdomen soft, non-tender and mildly obese without evidence of hernia.  Back No CVA tenderness Genito Urinary  Vulva/vagina: Normal external female genitalia.  No lesions. No discharge or bleeding.  Bladder/urethra:  No lesions or masses, well supported bladder  Vagina:  normal, no palpable leasions  Cervix and uterus surgically absent  Adnexa: no palpable masses. Rectal  deferred Extremities  No bilateral cyanosis, clubbing or edema.  I spent 45 minutes with this patient including greater than 50% of the time in direct face-to-face counseling communication.  This time included review of the patient's complex medical history, her family medical history, records from her prior surgery putting pathology records.  It included review of her genetics results and discussion with referring provider regarding her care and plan.  Due to the complexity of her diagnosis and difficult decision making this is a high complexity level case including threat of malignancy.  Thereasa Solo, MD  06/22/2019, 5:08 PM

## 2019-06-22 NOTE — Patient Instructions (Signed)
Dr Serita Grit office can be reached at [phone].  There are risks to removal of the ovary (increased all-cause mortality - shorter life expectancy). This is largely due to increased risk of cardiovascular related deaths at an earlier age.   However, removal of the ovary would eliminate your risk for ovarian cancer and might decrease breast cancer risk (by removing any residual ovarian stimulation of the ovary).   Ultimately, these risks are balanced out, therefore whether to proceed with removal of the ovary, or watchful waiting depend upon how you feel about these risks and which are of greatest concern/value to you.  Dr Denman George would be happy to offer robotic assisted laparoscopic left salping-oophorectomy (as an outpatient procedure with 2-4 week recovery) if you decide to proceed that way.

## 2019-07-04 ENCOUNTER — Ambulatory Visit
Admission: RE | Admit: 2019-07-04 | Discharge: 2019-07-04 | Disposition: A | Discharge status: Home / Self Care | Payer: 59 | Source: Ambulatory Visit | Attending: General Surgery | Admitting: General Surgery

## 2019-07-04 ENCOUNTER — Ambulatory Visit
Admission: RE | Admit: 2019-07-04 | Discharge: 2019-07-04 | Disposition: A | Discharge status: Home / Self Care | Payer: 59 | Source: Ambulatory Visit | Attending: Physician Assistant | Admitting: Physician Assistant

## 2019-07-04 ENCOUNTER — Ambulatory Visit
Admission: RE | Admit: 2019-07-04 | Discharge: 2019-07-04 | Disposition: A | Discharge status: Home / Self Care | Payer: 59 | Source: Ambulatory Visit | Attending: Orthopedic Surgery | Admitting: Orthopedic Surgery

## 2019-07-04 ENCOUNTER — Other Ambulatory Visit: Payer: Self-pay

## 2019-07-04 DIAGNOSIS — R918 Other nonspecific abnormal finding of lung field: Secondary | ICD-10-CM

## 2019-07-04 DIAGNOSIS — Z1239 Encounter for other screening for malignant neoplasm of breast: Secondary | ICD-10-CM

## 2019-07-04 DIAGNOSIS — M25552 Pain in left hip: Secondary | ICD-10-CM

## 2019-07-04 MED ORDER — GADOBUTROL 1 MMOL/ML IV SOLN
10.0000 mL | Freq: Once | INTRAVENOUS | Status: AC | PRN
  Administered 2019-07-04: 10 mL via INTRAVENOUS

## 2019-09-05 ENCOUNTER — Encounter: Payer: Self-pay | Admitting: Oncology

## 2019-09-07 ENCOUNTER — Telehealth: Payer: Self-pay | Admitting: Oncology

## 2019-09-07 ENCOUNTER — Other Ambulatory Visit: Payer: Self-pay | Admitting: Oncology

## 2019-09-07 MED ORDER — TAMOXIFEN CITRATE 20 MG PO TABS: 20.0000 mg | ORAL_TABLET | Freq: Every day | ORAL | 4 refills | Status: DC

## 2019-09-07 NOTE — Telephone Encounter (Signed)
Scheduled appt per 4/15 sch message - mailed reminder letter with appt date and time

## 2019-09-07 NOTE — Progress Notes (Signed)
Patient called to report she was tolerating tamoxifen with no side effects of concern.  She wanted the appointment moved to 1 year and I have put it in for March 2022.  I have also refilled her tamoxifen.

## 2019-09-18 ENCOUNTER — Ambulatory Visit: Payer: 59 | Admitting: Oncology

## 2019-11-15 ENCOUNTER — Telehealth: Payer: Self-pay | Admitting: *Deleted

## 2019-11-15 NOTE — Telephone Encounter (Signed)
Our office received a clearance request form for cardiac clearance. Left message for surgery scheduler to please call back with complete information for pt's bariatric surgery.  I did see notes 11/01/19 from Dr. Heron Nay with [facility] in regards to procedure. I did find the surgery seems to be Sleeve Gastrectomy with Dr. Heron Nay. However our office will need complete information such as; need type of anesthesia being used, if any medications are being requested to be held and how long, date of procedure, as well as verify the procedure is Sleeve gastrectomy.

## 2019-11-16 NOTE — Telephone Encounter (Signed)
   [facility] Pre-operative Risk Assessment    HEARTCARE STAFF: - Please ensure there is not already an duplicate clearance open for this procedure. - Under Visit Info/Reason for Call, type in Other and utilize the format Clearance MM/DD/YY or Clearance TBD. Do not use dashes or single digits. - If request is for dental extraction, please clarify the # of teeth to be extracted.  Request for surgical clearance:  1. What type of surgery is being performed? Sleeve Gastrectomy   2. When is this surgery scheduled? TBD   3. What type of clearance is required (medical clearance vs. Pharmacy clearance to hold med vs. Both)?   4. Are there any medications that need to be held prior to surgery and how long? ASA, recommendation from cardiology  5. Practice name and name of physician performing surgery? [facility], Dr. Jeneen Rinks Dasher   6. What is the office phone number? [phone]   7.   What is the office fax number? [phone]  8.   Anesthesia type (None, local, MAC, general) ? General   Jacqulynn Cadet 11/16/2019, 11:38 AM  _________________________________________________________________   (provider comments below)

## 2019-11-16 NOTE — Telephone Encounter (Signed)
   Primary Cardiologist:Peter Johnsie Cancel, MD  Chart reviewed as part of pre-operative protocol coverage. Because of Roe Koffman Buboltz's past medical history and time since last visit, they will require a follow-up visit in order to better assess preoperative cardiovascular risk.  Pre-op covering staff: - Please schedule appointment and call patient to inform them. If patient already had an upcoming appointment within acceptable timeframe, please add "pre-op clearance" to the appointment notes so provider is aware. - Please contact requesting surgeon's office via preferred method (i.e, phone, fax) to inform them of need for appointment prior to surgery.  If applicable, this message will also be routed to pharmacy pool and/or primary cardiologist for input on holding anticoagulant/antiplatelet agent as requested below so that this information is available to the clearing provider at time of patient's appointment.   [place], [place]  11/16/2019, 3:03 PM

## 2019-11-16 NOTE — Telephone Encounter (Signed)
Surgery scheduler returned call from yesterday to give needed information for the clearance.

## 2019-11-16 NOTE — Telephone Encounter (Signed)
Leave message on pt voicemail to give office a call to schedule an appointment

## 2019-11-23 ENCOUNTER — Telehealth: Payer: Self-pay | Admitting: Cardiovascular Disease

## 2019-11-23 NOTE — Telephone Encounter (Signed)
Pt wanted to know if she could get an EGD as part of her bariatric surgical clearance before she sees Richardson Dopp 01/03/20.

## 2019-11-23 NOTE — Telephone Encounter (Signed)
Patient is having an EGD prior to her bariatric surgery and she is wondering if she needs cardiac clearance prior to EGD. I advised her that she should reach out to whoever is performing the EGD to see if she needs clearance. Patient has an appointment with Richardson Dopp, PA-C for surgical clearance for her bariatric surgery on 08/11. She states she has EGD scheduled for prior to then and expresses concern if she does need clearance for EGD. Patient will reach out to whoever is doing the EGD to see if she will need cardiac clearance.

## 2019-11-28 ENCOUNTER — Other Ambulatory Visit: Payer: Self-pay | Admitting: Obstetrics and Gynecology

## 2019-11-28 DIAGNOSIS — R928 Other abnormal and inconclusive findings on diagnostic imaging of breast: Secondary | ICD-10-CM

## 2019-12-01 ENCOUNTER — Encounter: Payer: Self-pay | Admitting: Oncology

## 2019-12-06 ENCOUNTER — Ambulatory Visit
Admission: RE | Admit: 2019-12-06 | Discharge: 2019-12-06 | Disposition: A | Discharge status: Home / Self Care | Payer: 59 | Source: Ambulatory Visit | Attending: Obstetrics and Gynecology | Admitting: Obstetrics and Gynecology

## 2019-12-06 ENCOUNTER — Other Ambulatory Visit: Payer: Self-pay

## 2019-12-06 ENCOUNTER — Other Ambulatory Visit: Payer: Self-pay | Admitting: Obstetrics and Gynecology

## 2019-12-06 DIAGNOSIS — R921 Mammographic calcification found on diagnostic imaging of breast: Secondary | ICD-10-CM

## 2019-12-06 DIAGNOSIS — R928 Other abnormal and inconclusive findings on diagnostic imaging of breast: Secondary | ICD-10-CM

## 2019-12-11 ENCOUNTER — Other Ambulatory Visit: Payer: Self-pay

## 2019-12-11 ENCOUNTER — Ambulatory Visit
Admission: RE | Admit: 2019-12-11 | Discharge: 2019-12-11 | Disposition: A | Discharge status: Home / Self Care | Payer: 59 | Source: Ambulatory Visit | Attending: Obstetrics and Gynecology | Admitting: Obstetrics and Gynecology

## 2019-12-11 DIAGNOSIS — R921 Mammographic calcification found on diagnostic imaging of breast: Secondary | ICD-10-CM

## 2019-12-11 HISTORY — PX: BREAST BIOPSY: SHX20

## 2019-12-29 ENCOUNTER — Ambulatory Visit: Payer: 59 | Admitting: Physician Assistant

## 2020-01-02 NOTE — Progress Notes (Signed)
Cardiology Office Note:    Date:  01/03/2020   ID:  Christel Mormon, DOB 18-Apr-1966, MRN [id number]  PCP:  Harrison Mons, [place]  Cardiologist:  Jenkins Rouge, MD   Electrophysiologist:  None   Referring MD: No ref. provider found   Chief Complaint:  Surgical Clearance    Patient Profile:    Amber Rowe is a 55 y.o. female with:   PSVT  Hypertension   Asthma   GERD   Prior CV studies: ETT 10/20/17 98% PMHR; no ischemia; no exercise induced arrhythmia  Echocardiogram 10/20/17 Mild LVH, EF 60-65 no RWMA, trivial MR, normal RVSF   History of Present Illness:    Amber Rowe was last seen by Dr. Johnsie Cancel in 2019 for evaluation of SVT.  An echocardiogram showed normal EF and a plain ETT was normal.    She returns for surgical clearance.  She is to undergo bariatric surgery with Dr. Heron Nay with [facility].    She is here alone today.  She notes that she has occasional palpitations.  She recorded PVCs on her Apple watch.  She has had one other episode of SVT since her initial visit.  This was approximately 1 year ago.  She was able to break the episode with Valsalva.  She has not had recurrence.  She has not had chest discomfort with exertion.  She does have issues with her hips.  She has been treated for bursitis.  This limits some of her activity but she is able to climb steps, walk a couple blocks, vacuum her room, furniture, etc.  She does have some shortness of breath with exertion but this is stable without significant change.  She has not had orthopnea.  She does have occasional lower extremity swelling it seems to be related to high salt load.  She has not had syncope.  Past Medical History:  Diagnosis Date  . Allergy   . Asthma   . Depression   . Heart murmur   . Hypertension   . SVT (supraventricular tachycardia) ([place])    last time in 06/2017    Current Medications: Current Meds  Medication Sig  . acetaminophen (TYLENOL) 500 MG tablet Take 500 mg  by mouth. Take 2 pills prn  . albuterol (PROVENTIL HFA;VENTOLIN HFA) 108 (90 BASE) MCG/ACT inhaler Inhale 2 puffs into the lungs every 6 (six) hours as needed for wheezing.  Marland Kitchen ALPRAZolam (XANAX) 0.5 MG tablet Take 0.5 mg by mouth at bedtime as needed for sleep.  . cephALEXin (KEFLEX) 500 MG capsule Take 500 mg by mouth 2 (two) times daily.  . Doxylamine Succinate, Sleep, (UNISOM PO) Take 0.5 tablets by mouth at bedtime.  Marland Kitchen escitalopram (LEXAPRO) 5 MG tablet Take 2.5 mg by mouth daily.   . famotidine (PEPCID) 40 MG tablet Take 20 mg by mouth daily.   . hydrocortisone 2.5 % cream Apply 1 application topically as needed (dry skin).   Marland Kitchen ibuprofen (ADVIL,MOTRIN) 200 MG tablet Take 200 mg by mouth. Take 4 pills prn  . lisinopril (PRINIVIL,ZESTRIL) 10 MG tablet Take 5 mg by mouth daily.   Marland Kitchen loratadine (CLARITIN) 10 MG tablet Take 10 mg by mouth daily. Take 1/2 pill daily  . meloxicam (MOBIC) 15 MG tablet Take 15 mg by mouth daily.  . mometasone (ASMANEX) 220 MCG/INH inhaler Inhale 1 puff into the lungs daily.   . Multiple Vitamin (MULTIVITAMIN) capsule Take 1 capsule by mouth daily.  . Probiotic Product (SOLUBLE FIBER/PROBIOTICS PO) Take by mouth daily.  Marland Kitchen  propranolol (INDERAL) 10 MG tablet Take 1 tablet (10 mg total) by mouth as needed (palpitations).  Marland Kitchen rOPINIRole (REQUIP) 0.5 MG tablet Take 1 mg by mouth at bedtime.  . SUMAtriptan (IMITREX) 50 MG tablet Take 12.5 mg by mouth every 2 (two) hours as needed for migraine or headache. May repeat in 2 hours if headache persists or recurs.   . tamoxifen (NOLVADEX) 20 MG tablet Take 1 tablet (20 mg total) by mouth daily.  Marland Kitchen tretinoin (RETIN-A) 0.025 % cream tretinoin 0.025 % topical cream  APPLY PEA SIZE AMOUNT TO FACE NIGHTLY   Current Facility-Administered Medications for the 01/03/20 encounter (Office Visit) with Richardson Dopp T, PA-C  Medication  . 0.9 %  sodium chloride infusion     Allergies:   Latex and Erythromycin base   Social History    Tobacco Use  . Smoking status: Never Smoker  . Smokeless tobacco: Never Used  Vaping Use  . Vaping Use: Never used  Substance Use Topics  . Alcohol use: Yes    Comment: rare  . Drug use: No     Family Hx: The patient's family history includes Bipolar disorder in her father; Breast cancer (age of onset: 48) in her mother; Breast cancer (age of onset: 9) in her cousin; Breast cancer (age of onset: 76) in her maternal aunt; Cancer in her maternal grandmother and mother; Diabetes in her sister; Heart disease in her father; Leukemia in her brother.  ROS   EKGs/Labs/Other Test Reviewed:    EKG:  EKG is  ordered today.  The ekg ordered today demonstrates normal sinus rhythm, heart rate 94, rightward axis, incomplete right bundle branch block, nonspecific ST-T wave changes, QTC 435, no change since prior tracing  Recent Labs: Labs from PCP personally reviewed and interpreted (Care Everywhere) Hgb 14.7, A1c 5.4, SCr 0.81, K 4.5, ALT 22, TC 226, TG 121, HDL 49, LDL 155, TSH 2.81  Physical Exam:    VS:  BP 130/80   Pulse 94   Ht 5\' 7"  (1.702 m)   Wt 230 lb (104.3 kg)   SpO2 97%   BMI 36.02 kg/m     Wt Readings from Last 3 Encounters:  01/03/20 230 lb (104.3 kg)  06/22/19 221 lb (100.2 kg)  05/11/19 222 lb 4.8 oz (100.8 kg)     Constitutional:      Appearance: Healthy appearance. Not in distress.  Neck:     Vascular: JVD normal.  Pulmonary:     Effort: Pulmonary effort is normal.     Breath sounds: No wheezing. No rales.  Cardiovascular:     Normal rate. Regular rhythm. Normal S1. Normal S2.     Murmurs: There is no murmur.  Edema:    Peripheral edema absent.  Abdominal:     Palpations: Abdomen is soft.  Skin:    General: Skin is warm and dry.  Neurological:     Mental Status: Alert and oriented to person, place and time.     Cranial Nerves: Cranial nerves are intact.       ASSESSMENT & PLAN:    1. Preoperative cardiovascular examination   Amber Rowe's  perioperative risk of a major cardiac event is 0.9% according to the Revised Cardiac Risk Index (RCRI).  Therefore, she is at low risk for perioperative complications.   Her functional capacity is good at 5.07 METs according to the Duke Activity Status Index (DASI). Recommendations: According to ACC/AHA guidelines, no further cardiovascular testing needed.  The patient may proceed  to surgery at acceptable risk.     2. SVT (supraventricular tachycardia) (HCC) Rare episodes of SVT.  She has not had to take as needed beta-blocker therapy.  I have encouraged her to contact us if her episodes become more frequent.  We could certainly consider daily medication at that point.  We discussed when it would be appropriate to refer to EP for ablation.    3. Essential hypertension The patient's blood pressure is controlled on her current regimen.  Continue current therapy.    Dispo:  Return for as needed f/u with Dr. Johnsie Cancel .   Medication Adjustments/Labs and Tests Ordered: Current medicines are reviewed at length with the patient today.  Concerns regarding medicines are outlined above.  Tests Ordered: Orders Placed This Encounter  Procedures  . EKG 12-Lead   Medication Changes: No orders of the defined types were placed in this encounter.   Signed, Richardson Dopp, PA-C  01/03/2020 10:05 AM    [facility] [address], [place], [place]  [address] Phone: [phone]; Fax: [phone]

## 2020-01-03 ENCOUNTER — Other Ambulatory Visit: Payer: Self-pay

## 2020-01-03 ENCOUNTER — Encounter: Payer: Self-pay | Admitting: Physician Assistant

## 2020-01-03 ENCOUNTER — Ambulatory Visit: Payer: 59 | Admitting: Physician Assistant

## 2020-01-03 VITALS — BP 130/80 | HR 94 | Ht 67.0 in | Wt 230.0 lb

## 2020-01-03 DIAGNOSIS — I10 Essential (primary) hypertension: Secondary | ICD-10-CM

## 2020-01-03 DIAGNOSIS — I471 Supraventricular tachycardia, unspecified: Secondary | ICD-10-CM

## 2020-01-03 DIAGNOSIS — Z01810 Encounter for preprocedural cardiovascular examination: Secondary | ICD-10-CM | POA: Diagnosis not present

## 2020-01-03 NOTE — Patient Instructions (Addendum)
Medication Instructions:  Your physician recommends that you continue on your current medications as directed. Please refer to the Current Medication list given to you today.  Lab Work: None ordered today  Testing/Procedures: None ordered today  Follow-Up: At [facility], you and your health needs are our priority.  As part of our continuing mission to provide you with exceptional heart care, we have created designated Provider Care Teams.  These Care Teams include your primary Cardiologist (physician) and Advanced Practice Providers (APPs -  Physician Assistants and Nurse Practitioners) who all work together to provide you with the care you need, when you need it.  We recommend signing up for the patient portal called "MyChart".  Sign up information is provided on this After Visit Summary.  MyChart is used to connect with patients for Virtual Visits (Telemedicine).  Patients are able to view lab/test results, encounter notes, upcoming appointments, etc.  Non-urgent messages can be sent to your provider as well.   To learn more about what you can do with MyChart, go to [email or web address].    Your next appointment:   As needed  The format for your next appointment:   In Person  Provider:   Jenkins Rouge, MD

## 2020-04-14 ENCOUNTER — Emergency Department (HOSPITAL_COMMUNITY): Payer: 59

## 2020-04-14 ENCOUNTER — Emergency Department (HOSPITAL_COMMUNITY)
Admission: EM | Admit: 2020-04-14 | Discharge: 2020-04-14 | Disposition: A | Discharge status: Home / Self Care | Payer: 59 | Attending: Emergency Medicine | Admitting: Emergency Medicine

## 2020-04-14 ENCOUNTER — Encounter (HOSPITAL_COMMUNITY): Payer: Self-pay | Admitting: Emergency Medicine

## 2020-04-14 DIAGNOSIS — I10 Essential (primary) hypertension: Secondary | ICD-10-CM | POA: Diagnosis not present

## 2020-04-14 DIAGNOSIS — Z79899 Other long term (current) drug therapy: Secondary | ICD-10-CM | POA: Diagnosis not present

## 2020-04-14 DIAGNOSIS — N201 Calculus of ureter: Secondary | ICD-10-CM | POA: Insufficient documentation

## 2020-04-14 DIAGNOSIS — J45909 Unspecified asthma, uncomplicated: Secondary | ICD-10-CM | POA: Insufficient documentation

## 2020-04-14 DIAGNOSIS — Z91040 Latex allergy status: Secondary | ICD-10-CM | POA: Insufficient documentation

## 2020-04-14 DIAGNOSIS — R1032 Left lower quadrant pain: Secondary | ICD-10-CM | POA: Diagnosis present

## 2020-04-14 LAB — URINALYSIS, ROUTINE W REFLEX MICROSCOPIC
Bacteria, UA: NONE SEEN
Bilirubin Urine: NEGATIVE
Glucose, UA: NEGATIVE mg/dL
Ketones, ur: 20 mg/dL — AB
Leukocytes,Ua: NEGATIVE
Nitrite: NEGATIVE
Protein, ur: 30 mg/dL — AB
RBC / HPF: >50 RBC/hpf — ABNORMAL HIGH (ref 0–5)
Specific Gravity, Urine: 1.026 (ref 1.005–1.030)
pH: 5 (ref 5.0–8.0)

## 2020-04-14 LAB — CBC WITH DIFFERENTIAL/PLATELET
Abs Immature Granulocytes: 0.03 10*3/uL (ref 0.00–0.07)
Basophils Absolute: 0 10*3/uL (ref 0.0–0.1)
Basophils Relative: 0 %
Eosinophils Absolute: 0 10*3/uL (ref 0.0–0.5)
Eosinophils Relative: 0 %
HCT: 42.4 % (ref 36.0–46.0)
Hemoglobin: 13.2 g/dL (ref 12.0–15.0)
Immature Granulocytes: 0 %
Lymphocytes Relative: 13 %
Lymphs Abs: 1 10*3/uL (ref 0.7–4.0)
MCH: 29.7 pg (ref 26.0–34.0)
MCHC: 31.1 g/dL (ref 30.0–36.0)
MCV: 95.5 fL (ref 80.0–100.0)
Monocytes Absolute: 0.5 10*3/uL (ref 0.1–1.0)
Monocytes Relative: 6 %
Neutro Abs: 6.4 10*3/uL (ref 1.7–7.7)
Neutrophils Relative %: 81 %
Platelets: 222 10*3/uL (ref 150–400)
RBC: 4.44 MIL/uL (ref 3.87–5.11)
RDW: 14.2 % (ref 11.5–15.5)
WBC: 8 10*3/uL (ref 4.0–10.5)
nRBC: 0 % (ref 0.0–0.2)

## 2020-04-14 LAB — COMPREHENSIVE METABOLIC PANEL
ALT: 17 U/L (ref 0–44)
AST: 21 U/L (ref 15–41)
Albumin: 3.9 g/dL (ref 3.5–5.0)
Alkaline Phosphatase: 52 U/L (ref 38–126)
Anion gap: 10 (ref 5–15)
BUN: 13 mg/dL (ref 6–20)
CO2: 24 mmol/L (ref 22–32)
Calcium: 9.4 mg/dL (ref 8.9–10.3)
Chloride: 108 mmol/L (ref 98–111)
Creatinine, Ser: 0.8 mg/dL (ref 0.44–1.00)
GFR, Estimated: >60 mL/min (ref >60)
Glucose, Bld: 110 mg/dL — ABNORMAL HIGH (ref 70–99)
Potassium: 3.9 mmol/L (ref 3.5–5.1)
Sodium: 142 mmol/L (ref 135–145)
Total Bilirubin: 0.5 mg/dL (ref 0.3–1.2)
Total Protein: 6.9 g/dL (ref 6.5–8.1)

## 2020-04-14 MED ORDER — HYDROCODONE-ACETAMINOPHEN 5-325 MG PO TABS
2.0000 | ORAL_TABLET | ORAL | 0 refills | Status: AC | PRN
Start: 2020-04-14 — End: 2020-04-15

## 2020-04-14 MED ORDER — IOHEXOL 9 MG/ML PO SOLN: 500.0000 mL | ORAL | Status: AC

## 2020-04-14 MED ORDER — MORPHINE SULFATE (PF) 4 MG/ML IV SOLN
4.0000 mg | Freq: Once | INTRAVENOUS | Status: AC
  Administered 2020-04-14: 4 mg via INTRAVENOUS
  Filled 2020-04-14: qty 1

## 2020-04-14 MED ORDER — TAMSULOSIN HCL 0.4 MG PO CAPS: 0.4000 mg | ORAL_CAPSULE | Freq: Every day | ORAL | 0 refills | Status: AC

## 2020-04-14 MED ORDER — HYDROCODONE-ACETAMINOPHEN 5-325 MG PO TABS
1.0000 | ORAL_TABLET | Freq: Four times a day (QID) | ORAL | 0 refills | Status: DC | PRN
Start: 2020-04-14 — End: 2020-08-19

## 2020-04-14 MED ORDER — IOHEXOL 300 MG/ML  SOLN
100.0000 mL | Freq: Once | INTRAMUSCULAR | Status: AC | PRN
  Administered 2020-04-14: 100 mL via INTRAVENOUS

## 2020-04-14 MED ORDER — ONDANSETRON 4 MG PO TBDP: 4.0000 mg | ORAL_TABLET | Freq: Three times a day (TID) | ORAL | 0 refills | Status: DC | PRN

## 2020-04-14 NOTE — ED Triage Notes (Addendum)
Pt reports sudden onset of lower left sided pelvic pain and left flank. Denies any blood in urine, pt states she had a known stone in left kidney on prior scan. Hx of hysterectomy but still has left ovary. Reports difficulty with urination.

## 2020-04-14 NOTE — ED Notes (Signed)
To ct

## 2020-04-14 NOTE — ED Provider Notes (Signed)
Care received from [facility].  Please see her note for full HPI  In short, 54 year old female presents with left lower quadrant pain.  History of kidney stones.  Work-up by previous provider showed large amount of blood in the urine, ketones, proteinuria.  Other lab without abnormalities.  Her CT renal study did note a distal left ureteral calculus.  However there was an incidental finding of a new rounded 5 cm density mass adjacent to the contour of the stomach.  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  Per discussion with the patient, it was felt best to order CT abdomen pelvis with contrast to further evaluate this mass.  Patient does have a remote history of ovarian cancer in the past was considered to be high risk.  I personally followed up on a CT scan, which shows a likely postoperative seroma as an expansion from the mass.  No evidence of obstruction with the left kidney stone.  Pain is well controlled here in the ED.  Previous provider had sent in a short course of Norco, however the pharmacy is closed.  I did send in 3 extra pills to return 24-hour pharmacy. PDMP reviewed and is appropriate. Also started on Flomax.  Patient was encouraged to follow-up with her bariatric surgeon as well with urology provided in her discharge paperwork.  Return precautions discussed.  She was understanding and is agreeable.  At this stage in the ED course, the patient is medically screened and stable for discharge. Physical Exam  BP 134/74   Pulse 84   Temp 98.2 F (36.8 C) (Oral)   Resp 17   SpO2 97%   Physical Exam Vitals and nursing note reviewed.  Constitutional:      General: She is not in acute distress.    Appearance: She is well-developed.  HENT:     Head: Normocephalic and atraumatic.  Eyes:     Conjunctiva/sclera: Conjunctivae normal.  Cardiovascular:     Rate and Rhythm: Normal rate and regular rhythm.     Pulses: Normal pulses.     Heart sounds: Normal heart sounds.  No murmur heard.   Pulmonary:     Effort: Pulmonary effort is normal. No respiratory distress.     Breath sounds: Normal breath sounds.  Abdominal:     Palpations: Abdomen is soft.     Tenderness: There is abdominal tenderness. There is left CVA tenderness.  Musculoskeletal:     Cervical back: Neck supple.  Skin:    General: Skin is warm and dry.  Neurological:     Mental Status: She is alert.     ED Course/Procedures   Clinical Course as of Apr 14 2013  Sun Apr 14, 2020  1249 RBC / HPF(!): >50 [HK]  1249 Bacteria, UA: NONE SEEN [HK]  1249 WBC: 8.0 [HK]  1303 Creatinine: 0.80 [HK]    Clinical Course User Index [HK] Delia Heady, PA-C   Results for orders placed or performed during the hospital encounter of 04/14/20  Urinalysis, Routine w reflex microscopic Urine, Clean Catch  Result Value Ref Range   Color, Urine AMBER (A) YELLOW   APPearance HAZY (A) CLEAR   Specific Gravity, Urine 1.026 1.005 - 1.030   pH 5.0 5.0 - 8.0   Glucose, UA NEGATIVE NEGATIVE mg/dL   Hgb urine dipstick LARGE (A) NEGATIVE   Bilirubin Urine NEGATIVE NEGATIVE   Ketones, ur 20 (A) NEGATIVE mg/dL   Protein, ur 30 (A) NEGATIVE mg/dL   Nitrite NEGATIVE  NEGATIVE   Leukocytes,Ua NEGATIVE NEGATIVE   RBC / HPF >50 (H) 0 - 5 RBC/hpf   WBC, UA 0-5 0 - 5 WBC/hpf   Bacteria, UA NONE SEEN NONE SEEN   Mucus PRESENT    Ca Oxalate Crys, UA PRESENT   CBC with Differential  Result Value Ref Range   WBC 8.0 4.0 - 10.5 K/uL   RBC 4.44 3.87 - 5.11 MIL/uL   Hemoglobin 13.2 12.0 - 15.0 g/dL   HCT 42.4 36 - 46 %   MCV 95.5 80.0 - 100.0 fL   MCH 29.7 26.0 - 34.0 pg   MCHC 31.1 30.0 - 36.0 g/dL   RDW 14.2 11.5 - 15.5 %   Platelets 222 150 - 400 K/uL   nRBC 0.0 0.0 - 0.2 %   Neutrophils Relative % 81 %   Neutro Abs 6.4 1.7 - 7.7 K/uL   Lymphocytes Relative 13 %   Lymphs Abs 1.0 0.7 - 4.0 K/uL   Monocytes Relative 6 %   Monocytes Absolute 0.5 0.1 - 1.0 K/uL   Eosinophils Relative 0 %   Eosinophils  Absolute 0.0 0.0 - 0.5 K/uL   Basophils Relative 0 %   Basophils Absolute 0.0 0.0 - 0.1 K/uL   Immature Granulocytes 0 %   Abs Immature Granulocytes 0.03 0.00 - 0.07 K/uL  Comprehensive metabolic panel  Result Value Ref Range   Sodium 142 135 - 145 mmol/L   Potassium 3.9 3.5 - 5.1 mmol/L   Chloride 108 98 - 111 mmol/L   CO2 24 22 - 32 mmol/L   Glucose, Bld 110 (H) 70 - 99 mg/dL   BUN 13 6 - 20 mg/dL   Creatinine, Ser 0.80 0.44 - 1.00 mg/dL   Calcium 9.4 8.9 - 10.3 mg/dL   Total Protein 6.9 6.5 - 8.1 g/dL   Albumin 3.9 3.5 - 5.0 g/dL   AST 21 15 - 41 U/L   ALT 17 0 - 44 U/L   Alkaline Phosphatase 52 38 - 126 U/L   Total Bilirubin 0.5 0.3 - 1.2 mg/dL   GFR, Estimated >60 >60 mL/min   Anion gap 10 5 - 15   No results found.  Procedures  MDM       Garald Balding, PA-C 04/14/20 2014    Malvin Johns, MD 04/14/20 2036

## 2020-04-14 NOTE — ED Notes (Signed)
No pain at present 

## 2020-04-14 NOTE — ED Provider Notes (Signed)
[facility]ENC[facility] DEPARTMENT Provider Note   CSN: [id number] Arrival date & time: 04/14/20  1142     History Chief Complaint  Patient presents with  . Flank Pain    Amber Rowe is a 54 y.o. female with a past medical history of hypertension, anxiety, status post gastric bypass surgery on February 21, 2020 presenting to the ED for abdominal pain.  Yesterday started having some pressure sensation in her lower abdomen and left lower quadrant area.  Pain persisted today and reports having feelings of incomplete voiding.  She was told in the past that she had a left-sided kidney stone that was "just sitting there."  Denies any history of obstructive stone in the past.  She took Tylenol just prior to arrival with alleviation of pain.  Reports nausea but denies any vomiting.  States that since her surgery in September she has bowel movements every 3 days and denies any changes from this.  No vaginal discharge or abnormal vaginal bleeding.  She has had a hysterectomy in the past.  She admits that yesterday while trying to help her father-in-law move, she did not take any of her home medications and "ate like really unhealthy stuff."  So she is unsure if the pain is related to this.  Denies any history of bowel obstruction, fever, dysuria, hematuria, chest pain or shortness of breath.  No sick contacts with similar symptoms.  HPI     Past Medical History:  Diagnosis Date  . Allergy   . Asthma   . Depression   . Heart murmur   . Hypertension   . SVT (supraventricular tachycardia) ([place])    last time in 06/2017    Patient Active Problem List   Diagnosis Date Noted  . Breast cancer screening, high risk patient 05/11/2019  . At high risk for breast cancer 05/11/2019  . High risk of ovarian cancer 05/11/2019  . Essential hypertension 11/07/2013  . Anxiety 11/07/2013  . Asthma 11/07/2013  . Family history of breast cancer 07/15/2011  . Fibrocystic breast  changes 07/15/2011    Past Surgical History:  Procedure Laterality Date  . ABDOMINAL HYSTERECTOMY  04/2010  . COLONOSCOPY  03/25/2018  . GASTRECTOMY    . NASAL SINUS SURGERY  2005     OB History   No obstetric history on file.     Family History  Problem Relation Age of Onset  . Cancer Mother        breast  . Breast cancer Mother 5  . Heart disease Father   . Bipolar disorder Father   . Cancer Maternal Grandmother        skin  . Breast cancer Maternal Aunt 60  . Breast cancer Cousin 70  . Diabetes Sister   . Leukemia Brother     Social History   Tobacco Use  . Smoking status: Never Smoker  . Smokeless tobacco: Never Used  Vaping Use  . Vaping Use: Never used  Substance Use Topics  . Alcohol use: Yes    Comment: rare  . Drug use: No    Home Medications Prior to Admission medications   Medication Sig Start Date End Date Taking? Authorizing Provider  acetaminophen (TYLENOL) 500 MG tablet Take 500 mg by mouth. Take 2 pills prn    [provider]  albuterol (PROVENTIL HFA;VENTOLIN HFA) 108 (90 BASE) MCG/ACT inhaler Inhale 2 puffs into the lungs every 6 (six) hours as needed for wheezing.    [provider]  ALPRAZolam (XANAX) 0.5 MG tablet Take 0.5 mg by mouth at bedtime as needed for sleep. 02/19/17   [provider]  cephALEXin (KEFLEX) 500 MG capsule Take 500 mg by mouth 2 (two) times daily. 05/31/19   [provider]  Doxylamine Succinate, Sleep, (UNISOM PO) Take 0.5 tablets by mouth at bedtime.    [provider]  escitalopram (LEXAPRO) 5 MG tablet Take 2.5 mg by mouth daily.  09/16/16 01/03/20  [provider]  famotidine (PEPCID) 40 MG tablet Take 20 mg by mouth daily.     [provider]  HYDROcodone-acetaminophen (NORCO/VICODIN) 5-325 MG tablet Take 1 tablet by mouth every 6 (six) hours as needed. 04/14/20   Karel Mowers, PA-C  hydrocortisone 2.5 % cream Apply 1 application topically as needed (dry  skin).  06/16/11   [provider]  ibuprofen (ADVIL,MOTRIN) 200 MG tablet Take 200 mg by mouth. Take 4 pills prn    [provider]  lisinopril (PRINIVIL,ZESTRIL) 10 MG tablet Take 5 mg by mouth daily.  07/17/12   [provider]  loratadine (CLARITIN) 10 MG tablet Take 10 mg by mouth daily. Take 1/2 pill daily    [provider]  meloxicam (MOBIC) 15 MG tablet Take 15 mg by mouth daily. 03/15/19   [provider]  mometasone (ASMANEX) 220 MCG/INH inhaler Inhale 1 puff into the lungs daily.     [provider]  Multiple Vitamin (MULTIVITAMIN) capsule Take 1 capsule by mouth daily.    [provider]  ondansetron (ZOFRAN ODT) 4 MG disintegrating tablet Take 1 tablet (4 mg total) by mouth every 8 (eight) hours as needed for nausea or vomiting. 04/14/20   Nanako Stopher, PA-C  Probiotic Product (SOLUBLE FIBER/PROBIOTICS PO) Take by mouth daily.    [provider]  propranolol (INDERAL) 10 MG tablet Take 1 tablet (10 mg total) by mouth as needed (palpitations). 09/09/17   Josue Hector, MD  rOPINIRole (REQUIP) 0.5 MG tablet Take 1 mg by mouth at bedtime.    [provider]  SUMAtriptan (IMITREX) 50 MG tablet Take 12.5 mg by mouth every 2 (two) hours as needed for migraine or headache. May repeat in 2 hours if headache persists or recurs.     [provider]  tamoxifen (NOLVADEX) 20 MG tablet Take 1 tablet (20 mg total) by mouth daily. 09/07/19   Magrinat, Virgie Dad, MD  tretinoin (RETIN-A) 0.025 % cream tretinoin 0.025 % topical cream  APPLY PEA SIZE AMOUNT TO FACE NIGHTLY    [provider]    Allergies    Latex and Erythromycin base  Review of Systems   Review of Systems  Constitutional: Negative for appetite change, chills and fever.  HENT: Negative for ear pain, rhinorrhea, sneezing and sore throat.   Eyes: Negative for photophobia and visual disturbance.  Respiratory: Negative for cough, chest  tightness, shortness of breath and wheezing.   Cardiovascular: Negative for chest pain and palpitations.  Gastrointestinal: Positive for abdominal pain and nausea. Negative for blood in stool, constipation, diarrhea and vomiting.  Genitourinary: Positive for difficulty urinating and flank pain. Negative for dysuria, hematuria and urgency.  Musculoskeletal: Negative for myalgias.  Skin: Negative for rash.  Neurological: Negative for dizziness, weakness and light-headedness.    Physical Exam Updated Vital Signs BP 120/69   Pulse 79   Temp (!) 97.5 F (36.4 C) (Oral)   Resp 14   SpO2 96%   Physical Exam Vitals and nursing note reviewed.  Constitutional:  General: She is not in acute distress.    Appearance: She is well-developed.  HENT:     Head: Normocephalic and atraumatic.     Nose: Nose normal.  Eyes:     General: No scleral icterus.       Left eye: No discharge.     Conjunctiva/sclera: Conjunctivae normal.  Cardiovascular:     Rate and Rhythm: Normal rate and regular rhythm.     Heart sounds: Normal heart sounds. No murmur heard.  No friction rub. No gallop.   Pulmonary:     Effort: Pulmonary effort is normal. No respiratory distress.     Breath sounds: Normal breath sounds.  Abdominal:     General: Bowel sounds are normal. There is no distension.     Palpations: Abdomen is soft.     Tenderness: There is no abdominal tenderness. There is no guarding.     Comments: Reports improvement in pain with palpation in suprapubic area.  Musculoskeletal:        General: Normal range of motion.     Cervical back: Normal range of motion and neck supple.  Skin:    General: Skin is warm and dry.     Findings: No rash.  Neurological:     Mental Status: She is alert.     Motor: No abnormal muscle tone.     Coordination: Coordination normal.     ED Results / Procedures / Treatments   Labs (all labs ordered are listed, but only abnormal results are displayed) Labs  Reviewed  URINALYSIS, ROUTINE W REFLEX MICROSCOPIC - Abnormal; Notable for the following components:      Result Value   Color, Urine AMBER (*)    APPearance HAZY (*)    Hgb urine dipstick LARGE (*)    Ketones, ur 20 (*)    Protein, ur 30 (*)    RBC / HPF >50 (*)    All other components within normal limits  COMPREHENSIVE METABOLIC PANEL - Abnormal; Notable for the following components:   Glucose, Bld 110 (*)    All other components within normal limits  URINE CULTURE  CBC WITH DIFFERENTIAL/PLATELET    EKG None  Radiology No results found.  Procedures Procedures (including critical care time)  Medications Ordered in ED Medications  morphine 4 MG/ML injection 4 mg (4 mg Intravenous Given 04/14/20 1247)    ED Course  I have reviewed the triage vital signs and the nursing notes.  Pertinent labs & imaging results that were available during my care of the patient were reviewed by me and considered in my medical decision making (see chart for details).  Clinical Course as of Apr 14 1554  Sun Apr 14, 2020  1249 RBC / HPF(!): >50 [HK]  1249 Bacteria, UA: NONE SEEN [HK]  1249 WBC: 8.0 [HK]  1303 Creatinine: 0.80 [HK]    Clinical Course User Index [HK] Delia Heady, PA-C   MDM Rules/Calculators/A&P                          54 year old female with a past medical history of hypertension, anxiety, status post gastric bypass surgery on 02/21/2020 presenting to the ED for abdominal pain.  Reports pressure sensation lower abdomen the left lower quadrant.  Reports feelings of incomplete voiding as well.  Pain improved with Tylenol taken prior to arrival.  Was told she had a left-sided kidney stone in the past.  She does admit to missing her home medications  that she supposed to take daily yesterday after her surgery.  Had a bowel movement today.  No bloody stools.  No hematuria.  No fever, chest pain.  On exam there is no significant tenderness of the abdomen.  No CVA tenderness  bilaterally.  She is afebrile.  Lab work significant for hematuria, normal creatinine and WBC count.  No signs of infection in the urine.  CT renal stone study shows distal left 4 mm stone which most likely the cause of her pain.  Symptoms controlled here.  There is also a new 5 cm mass noted on the greater curvature of the stomach.  This is new from 2017.  I spoke to the radiologist regarding which type of contrast and what he is suspicious for.  He recommends IV contrast and may be a small amount of p.o. contrast just prior to the scan.  This may be due to her recent surgery.  Had a discussion with the patient regarding getting this outpatient or here.  She would prefer getting the scan here.  She does not have a personal history of ovarian cancer but states that her mother does.  She has had a hysterectomy but still has a left ovary.  Will recheck after contrasted CT scan. In the meantime she will need to be treated for symptomatic ureterolithiasis. Care handed off to oncoming provider pending recheck after CT.  Anticipate discharge home with outpatient f/u.  All imaging, if done today, including plain films, CT scans, and ultrasounds, independently reviewed by me, and interpretations confirmed via formal radiology reads.  Prior to providing a prescription for a controlled substance, I independently reviewed the patient's recent prescription history on the [facility]. The patient had no recent or regular prescriptions and was deemed appropriate for a brief, less than 3 day prescription of narcotic for acute analgesia.  Portions of this note were generated with [employer]. Dictation errors may occur despite best attempts at proofreading.  Final Clinical Impression(s) / ED Diagnoses Final diagnoses:  Ureterolithiasis    Rx / DC Orders ED Discharge Orders         Ordered    HYDROcodone-acetaminophen (NORCO/VICODIN) 5-325 MG tablet  Every 6  hours PRN        04/14/20 1542    ondansetron (ZOFRAN ODT) 4 MG disintegrating tablet  Every 8 hours PRN        04/14/20 1542           Delia Heady, PA-C 04/14/20 1557    Malvin Johns, MD 04/29/20 [phone]

## 2020-04-14 NOTE — ED Notes (Signed)
Waiting  for ct

## 2020-04-14 NOTE — Discharge Instructions (Addendum)
Your CT scan showed that you have a 4 mm kidney stone on the left side that is causing your pain. Take the pain and nausea medication as prescribed. Please take the Flomax daily as directed. Your CT scan also showed likely a Seroma after your gastric sleeve surgery.   Please followup with your bariatric doctor as well as nephrology.   Return to the ER for new or worsening symptoms

## 2020-04-15 LAB — URINE CULTURE: Culture: NO GROWTH

## 2020-06-20 ENCOUNTER — Other Ambulatory Visit: Payer: Self-pay | Admitting: General Surgery

## 2020-06-20 DIAGNOSIS — Z803 Family history of malignant neoplasm of breast: Secondary | ICD-10-CM

## 2020-07-13 ENCOUNTER — Other Ambulatory Visit: Payer: 59

## 2020-07-17 ENCOUNTER — Telehealth: Payer: Self-pay | Admitting: Oncology

## 2020-07-17 ENCOUNTER — Telehealth: Payer: Self-pay

## 2020-07-17 NOTE — Telephone Encounter (Signed)
Scheduled appt per 2/23 sch msg - pt is aware of new appt.

## 2020-07-17 NOTE — Telephone Encounter (Signed)
RN returned call regarding rescheduling appointment, and discontinuing Tamoxifen.   RN sent scheduling message to have reschedule, RN informed patient it would be beneficial to see Dr. Jana Hakim to review Tamoxifen.  Voicemail left with contact information to call back.

## 2020-07-28 ENCOUNTER — Other Ambulatory Visit: Payer: 59

## 2020-08-08 ENCOUNTER — Ambulatory Visit: Payer: 59 | Admitting: Oncology

## 2020-08-11 ENCOUNTER — Ambulatory Visit
Admission: RE | Admit: 2020-08-11 | Discharge: 2020-08-11 | Disposition: A | Discharge status: Home / Self Care | Payer: 59 | Source: Ambulatory Visit | Attending: General Surgery | Admitting: General Surgery

## 2020-08-11 ENCOUNTER — Other Ambulatory Visit: Payer: Self-pay

## 2020-08-11 DIAGNOSIS — Z803 Family history of malignant neoplasm of breast: Secondary | ICD-10-CM

## 2020-08-11 MED ORDER — GADOBUTROL 1 MMOL/ML IV SOLN
10.0000 mL | Freq: Once | INTRAVENOUS | Status: AC | PRN
  Administered 2020-08-11: 10 mL via INTRAVENOUS

## 2020-08-18 NOTE — Progress Notes (Signed)
[place]  [phone] [phone]     ID: Amber Rowe DOB: Oct 11, 1965  MR#: [id number]  TDS#:[id number]  Patient Care Team: Harrison Mons, Blaine as PCP - General (Family Medicine) Josue Hector, MD as PCP - Cardiology (Cardiology) Olga Millers, MD as Consulting Physician (Obstetrics and Gynecology) Jerrell Belfast, MD as Consulting Physician (Otolaryngology) Josue Hector, MD as Consulting Physician (Cardiology) Thornton Park, MD as Consulting Physician (Gastroenterology) Arelyn Gauer, Virgie Dad, MD as Consulting Physician (Oncology) Gaynelle Arabian, MD as Consulting Physician (Orthopedic Surgery) Rolm Bookbinder, MD as Consulting Physician (General Surgery) Chauncey Cruel, MD OTHER MD:  CHIEF COMPLAINT: Breast and ovarian cancer high risk  CURRENT TREATMENT: tamoxifen at 10 mg daily; continue intensified screening   INTERVAL HISTORY: Amber Rowe returns today for follow up of her high risk for breast and ovarian cancer. She was evaluated in the high risk breast cancer clinic on 05/11/2019.  She was started on tamoxifen at consultation. She I developed intractable yeast infections and she finally took herself off the medication as the only way to clear that.  She did not have any other significant side effects that she is aware of  Since her last visit, she had screening mammography showing left breast calcifications. She underwent diagnostic left mammography at [facility] on 12/06/2019 showing: breast density category C; suspicious 0.9 cm calcifications over upper-outer left breast.  She proceeded to biopsy of the left breast area in question on 12/11/2019. Pathology [phone]) showed fibrocystic change with calcifications.  She also underwent breast MRI on 08/11/2020 showing: breast composition C; no evidence of malignancy.  Of note, she also underwent gastric sleeve procedure on 02/21/2020 at [facility].  She presented  to the ED on 04/14/2020 with flank pain and left groin pain. She underwent renal stone study showing: probable distal left ureteral calculus approximately 1 cm from left vesicoureteral junction; mild hydroureter on left; non-obstructing left renal calculus; new 5 cm intermediate-density mass adjacent to greater curvature of stomach, may be related to sleeve gastronomy; stable prominent mesenteric lymph nodes.  This was followed by CT abdomen/pelvis with contrast showing: nonenhancing cystic lesion seen adjacent to mid body of stomach which could represent a postoperative seroma; mild left pelviectasis with a 4 mm distal left UVJ calculus.   REVIEW OF SYSTEMS: Amber Rowe has lost upwards of 50 pounds thanks to her bariatric surgery.  She is now walking for exercise and doing some weights.  She says her knees are better.  She feels better in general.  After her kidney stone her blood pressure medications were changed and she is now off lisinopril.  A detailed review of systems was otherwise stable  COVID 19 VACCINATION STATUS:    HISTORY OF CURRENT ILLNESS: From the original intake note:  Amber Rowe has a significant family history of breast and ovarian cancer, detailed in the family history below. Her mother was diagnosed with breast cancer at age 53 and with what may have been ovarian cancer at age 77.  The ovarian cancer however may have been metastatic breast cancer.  A maternal aunt was diagnosed with breast cancer at age 41 and a maternal great grandmother may have had ovarian cancer.    A paternal cousin was diagnosed with breast cancer at age 39.   Amber Rowe underwent [facility] genetic counseling on 01/23/2019. 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  She has been followed with intensified screening under Dr. Excell Seltzer, who recently retired. Her  most recent breast MRI was performed on 02/11/2018 and showed: breast composition C; no evidence  of malignancy; scattered small benign cysts again noted.  Her most recent mammogram was performed in 10/2018 and showed a possible abnormality in the right breast. She underwent right diagnostic mammography with tomography and right breast ultrasonography at [facility] on 11/15/2018 showing: breast density category C; two benign-appearing adjacent cysts, the larger measuring 7 mm, in the right breast at 6:30; no evidence of malignancy.  The patient's subsequent history is as detailed below.   PAST MEDICAL HISTORY: Past Medical History:  Diagnosis Date  . Allergy   . Asthma   . Depression   . Heart murmur   . Hypertension   . SVT (supraventricular tachycardia) ([place])    last time in 06/2017  Possible mesenteritis Lung lesion in lingula noted 2017   PAST SURGICAL HISTORY: Past Surgical History:  Procedure Laterality Date  . ABDOMINAL HYSTERECTOMY  04/2010  . COLONOSCOPY  03/25/2018  . GASTRECTOMY    . NASAL SINUS SURGERY  2005    FAMILY HISTORY: Family History  Problem Relation Age of Onset  . Cancer Mother        breast  . Breast cancer Mother 56  . Heart disease Father   . Bipolar disorder Father   . Cancer Maternal Grandmother        skin  . Breast cancer Maternal Aunt 60  . Breast cancer Cousin 71  . Diabetes Sister   . Leukemia Brother   As of December 2020 patient's father is 32 years old.  He had 1 sister, with no history of cancer.  However that sister had a daughter diagnosed with cancer in her early 94s, and then later with a second breast cancer.  The patient's father also had 1 brother with no history of cancer.  His parents, the patient's paternal grandparents had no history of cancer.  As of December 2020 the patient's mother is 25 years old.  She had breast cancer at age 55, and then at age 14 she was found to have significant peritoneal disease involving the ovaries and pelvis.  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  That was 13 years ago and the patient is still alive so it is unlikely to have been ovarian cancer.  The patient's mother had 1 sister who was diagnosed with breast cancer late in life.  She had no brothers.  The patient's mother's grandmother had what may have been ovarian cancer ("they opened her up and there was cancer everywhere").   The patient herself has 2 sisters, with no history of cancer.  She has a half brother (father's side) 32 years old as of December 2020, with a history of acute lymphoid leukemia, and unfortunately with cardiomyopathy secondary to the treatment for the leukemia necessitating a cardiac transplant.   GYNECOLOGIC HISTORY:  No LMP recorded. Patient has had a hysterectomy. Menarche: 55 years old Age at first live birth: 55 years old Amber Rowe 1 Contraceptive: Briefly on oral contraceptives, with no side effects. HRT no  Hysterectomy? Yes, 04/2009 BSO?  Status post right salpingo-oophorectomy. 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  Her  husband Amber Rowe is a Sales promotion account executive.  Their son Amber Rowe died suddenly during football training in 2016.  He was 14 at the time.  She showed me his photo, a strapping young man who looks serious and competent.  The patient tells me she has a strong faith.    ADVANCED DIRECTIVES: In the absence of any documentation to the contrary, the patient's spouse is her HCPOA.    HEALTH MAINTENANCE: Social History   Tobacco Use  . Smoking status: Never Smoker  . Smokeless tobacco: Never Used  Vaping Use  . Vaping Use: Never used  Substance Use Topics  . Alcohol use: Yes    Comment: rare  . Drug use: No     Colonoscopy: 04/2018 (Dr. Tarri Glenn),  repeat in 10 years  PAP: 05/2015, negative; status post hysterectomy  Bone density: n/a (age)   Allergies  Allergen Reactions  . Latex Rash    swelling  . Erythromycin Base Diarrhea and Nausea Only    Current Outpatient Medications  Medication Sig Dispense Refill  . ergocalciferol (VITAMIN D2) 1.25 MG (50000 UT) capsule Take 1 capsule (50,000 Units total) by mouth once a week. 6 capsule 0  . tamoxifen (NOLVADEX) 10 MG tablet Take 1 tablet (10 mg total) by mouth daily. 90 tablet 4  . acetaminophen (TYLENOL) 500 MG tablet Take 500 mg by mouth. Take 2 pills prn    . ALPRAZolam (XANAX) 0.5 MG tablet Take 0.5 mg by mouth at bedtime as needed for sleep.    Marland Kitchen Doxylamine Succinate, Sleep, (UNISOM PO) Take 0.5 tablets by mouth at bedtime.    Marland Kitchen escitalopram (LEXAPRO) 5 MG tablet Take 2.5 mg by mouth daily.     . hydrocortisone 2.5 % cream Apply 1 application topically as needed (dry skin). 30 g 0  . lisinopril (PRINIVIL,ZESTRIL) 10 MG tablet Take 5 mg by mouth daily.     Marland Kitchen loratadine (CLARITIN) 10 MG tablet Take 10 mg by mouth daily. Take 1/2 pill daily    . Multiple Vitamin (MULTIVITAMIN) capsule Take 1 capsule by mouth daily.    . Probiotic Product (SOLUBLE FIBER/PROBIOTICS PO) Take by mouth daily.    . propranolol (INDERAL) 10 MG tablet Take 1 tablet (10 mg total) by mouth as needed (palpitations). 30 tablet 3  . SUMAtriptan (IMITREX) 50 MG tablet Take 12.5 mg by mouth every 2 (two) hours as needed for migraine or headache. May repeat in 2 hours if headache persists or recurs.      Current Facility-Administered Medications  Medication Dose Route Frequency Provider Last Rate Last Admin  . 0.9 %  sodium chloride infusion  500 mL Intravenous Once Thornton Park, MD        OBJECTIVE: Middle-aged white woman who appears stated age  Vitals:   08/19/20 0915  BP: 124/82  Pulse: 71  Resp: 16  Temp: (!) 97.5 F (36.4 C)  SpO2: 99%     Body mass index is 27.66 kg/m.   Wt Readings from  Last 3 Encounters:  08/19/20 176 lb 9.6 oz (80.1 kg)  01/03/20 230 lb (104.3 kg)  06/22/19 221 lb (100.2 kg)      ECOG FS:1 - Symptomatic but completely ambulatory  Sclerae unicteric, EOMs intact Wearing a mask No cervical or supraclavicular adenopathy Lungs no rales or rhonchi Heart regular rate and rhythm Abd soft, nontender, positive bowel sounds MSK no focal spinal tenderness, no upper extremity lymphedema Neuro: nonfocal, well oriented, appropriate affect Breasts: No suspicious masses, no skin or nipple  changes of concern.  Both axillae are benign.   LAB RESULTS:  CMP     Component Value Date/Time   NA 142 04/14/2020 1242   K 3.9 04/14/2020 1242   CL 108 04/14/2020 1242   CO2 24 04/14/2020 1242   GLUCOSE 110 (H) 04/14/2020 1242   BUN 13 04/14/2020 1242   CREATININE 0.80 04/14/2020 1242   CALCIUM 9.4 04/14/2020 1242   PROT 6.9 04/14/2020 1242   ALBUMIN 3.9 04/14/2020 1242   AST 21 04/14/2020 1242   ALT 17 04/14/2020 1242   ALKPHOS 52 04/14/2020 1242   BILITOT 0.5 04/14/2020 1242   GFRNONAA >60 04/14/2020 1242   GFRAA >60 06/29/2017 1446    No results found for: TOTALPROTELP, ALBUMINELP, A1GS, A2GS, BETS, BETA2SER, GAMS, MSPIKE, SPEI  Lab Results  Component Value Date   WBC 8.0 04/14/2020   NEUTROABS 6.4 04/14/2020   HGB 13.2 04/14/2020   HCT 42.4 04/14/2020   MCV 95.5 04/14/2020   PLT 222 04/14/2020    No results found for: LABCA2  No components found for: QPYPPJ093  No results for input(s): INR in the last 168 hours.  No results found for: LABCA2  No results found for: OIZ124  No results found for: PYK998  No results found for: PJA250  No results found for: CA2729  No components found for: HGQUANT  No results found for: CEA1 / No results found for: CEA1   No results found for: AFPTUMOR  No results found for: CHROMOGRNA  No results found for: KPAFRELGTCHN, LAMBDASER, KAPLAMBRATIO (kappa/lambda light chains)  No results found for:  HGBA, HGBA2QUANT, HGBFQUANT, HGBSQUAN (Hemoglobinopathy evaluation)   No results found for: LDH  No results found for: IRON, TIBC, IRONPCTSAT (Iron and TIBC)  No results found for: FERRITIN  Urinalysis    Component Value Date/Time   COLORURINE AMBER (A) 04/14/2020 1200   APPEARANCEUR HAZY (A) 04/14/2020 1200   LABSPEC 1.026 04/14/2020 1200   PHURINE 5.0 04/14/2020 1200   GLUCOSEU NEGATIVE 04/14/2020 1200   HGBUR LARGE (A) 04/14/2020 1200   BILIRUBINUR NEGATIVE 04/14/2020 1200   KETONESUR 20 (A) 04/14/2020 1200   PROTEINUR 30 (A) 04/14/2020 1200   NITRITE NEGATIVE 04/14/2020 1200   LEUKOCYTESUR NEGATIVE 04/14/2020 1200    STUDIES: MR BREAST BILATERAL W WO CONTRAST INC CAD  Result Date: 08/12/2020 CLINICAL DATA:  High risk screening breast MRI. Strong family hx of mother @ 29y/o, aunt @ 41y/o, cousin @ 51y/o. Previous negative bx on left breast 7.19.2021. Pt is asymptomatic today and has no c/o palpable lumps, skin changes, nipple discharge or retraction. LABS:  No labs drawn at time of imaging. EXAM: BILATERAL BREAST MRI WITH AND WITHOUT CONTRAST TECHNIQUE: Multiplanar, multisequence MR images of both breasts were obtained prior to and following the intravenous administration of 10 ml of Gadavist Three-dimensional MR images were rendered by post-processing of the original MR data on an independent workstation. The three-dimensional MR images were interpreted, and findings are reported in the following complete MRI report for this study. Three dimensional images were evaluated at the independent interpreting workstation using the DynaCAD thin client. COMPARISON:  Prior exams including multiple prior breast MRIs, most recent dated 07/04/2019. FINDINGS: Breast composition: c. Heterogeneous fibroglandular tissue. Background parenchymal enhancement: Moderate. Right breast: No mass or abnormal enhancement. Left breast: No mass or abnormal enhancement. Susceptibility artifact reflecting the post  biopsy clip lies in the upper outer left breast. Several small scattered left breast cysts are stable. Lymph nodes: No abnormal appearing lymph nodes.  Ancillary findings:  None. IMPRESSION: 1. No evidence of breast malignancy. RECOMMENDATION: 1. Annual screening mammography. 2. Given patient's family history of breast cancer, would consider the addition of annual screening breast MRI to annual screening mammography. Per American Cancer Society guidelines, annual screening MRI of the breasts is recommended if a risk assessment calculation for breast cancer, preferably using the Tyrer-Cuzick model, measures greater than 20%. BI-RADS CATEGORY  2: Benign. Electronically Signed   By: Lajean Manes M.D.   On: 08/12/2020 09:38      ASSESSMENT: 55 y.o. [place], [place] woman at high risk for both breast and ovarian cancer.  (1) breast cancer risk greater than 30% lifetime on [employer] model             (a) intensified screening: per [facility]             (b) tamoxifen started December 2020  (2) ovarian cancer risk?:             (a) mother and maternal great-grandmother may have had ovarian cancer             (b) ATM variant of unknown significance  (3) lingular lesion noted on CT scan November 2017             (a) follow-up CT scan 07/04/2019 shows a stable area of linear scarring in the lingula.  No further follow-up is recommended  PLAN: Lelar could not tolerate tamoxifen at 20 mg daily.  We are using this for prevention so we could simply stay off that medication.  However she would like to give it a second try.  We are going to go with 10 mg daily.  If she tolerates that then we will continue to a total of 5years.  Otherwise we will simply discontinue that medication.  Today as a courtesy I refilled her vitamin D and steroid cream but suggested she get these refilled through her other physicians longer-term to make sure she is getting what she needs after her bariatric  surgery.  I commended her exercise program  She will have mammography in September of this year and a breast MRI in March 2023 and she will see [place] in April 2023  Total encounter time 25 minutes.Chauncey Cruel, MD   08/19/2020 9:37 AM Medical Oncology and Hematology [facility] [address] [place], [place] [address] Tel. [phone]    Fax. [phone]   This document serves as a record of services personally performed by Lurline Del, MD. It was created on his behalf by Wilburn Mylar, a trained medical scribe. The creation of this record is based on the scribe's personal observations and the provider's statements to them.   I, Lurline Del MD, have reviewed the above documentation for accuracy and completeness, and I agree with the above.   *Total Encounter Time as defined by the Centers for Medicare and Medicaid Services includes, in addition to the face-to-face time of a patient visit (documented in the note above) non-face-to-face time: obtaining and reviewing outside history, ordering and reviewing medications, tests or procedures, care coordination (communications with other health care professionals or caregivers) and documentation in the medical record.

## 2020-08-19 ENCOUNTER — Other Ambulatory Visit: Payer: Self-pay

## 2020-08-19 ENCOUNTER — Inpatient Hospital Stay: Payer: 59 | Attending: Oncology | Admitting: Oncology

## 2020-08-19 VITALS — BP 124/82 | HR 71 | Temp 97.5°F | Resp 16 | Ht 67.0 in | Wt 176.6 lb

## 2020-08-19 DIAGNOSIS — Z8041 Family history of malignant neoplasm of ovary: Secondary | ICD-10-CM | POA: Diagnosis not present

## 2020-08-19 DIAGNOSIS — Z9189 Other specified personal risk factors, not elsewhere classified: Secondary | ICD-10-CM

## 2020-08-19 DIAGNOSIS — Z8249 Family history of ischemic heart disease and other diseases of the circulatory system: Secondary | ICD-10-CM | POA: Insufficient documentation

## 2020-08-19 DIAGNOSIS — Z1502 Genetic susceptibility to malignant neoplasm of ovary: Secondary | ICD-10-CM | POA: Diagnosis present

## 2020-08-19 DIAGNOSIS — Z1239 Encounter for other screening for malignant neoplasm of breast: Secondary | ICD-10-CM | POA: Diagnosis not present

## 2020-08-19 DIAGNOSIS — Z79899 Other long term (current) drug therapy: Secondary | ICD-10-CM | POA: Insufficient documentation

## 2020-08-19 DIAGNOSIS — Z1501 Genetic susceptibility to malignant neoplasm of breast: Secondary | ICD-10-CM | POA: Diagnosis present

## 2020-08-19 DIAGNOSIS — I10 Essential (primary) hypertension: Secondary | ICD-10-CM | POA: Diagnosis not present

## 2020-08-19 DIAGNOSIS — Z803 Family history of malignant neoplasm of breast: Secondary | ICD-10-CM | POA: Insufficient documentation

## 2020-08-19 DIAGNOSIS — Z833 Family history of diabetes mellitus: Secondary | ICD-10-CM | POA: Diagnosis not present

## 2020-08-19 DIAGNOSIS — Z79810 Long term (current) use of selective estrogen receptor modulators (SERMs): Secondary | ICD-10-CM | POA: Diagnosis not present

## 2020-08-19 MED ORDER — ERGOCALCIFEROL 1.25 MG (50000 UT) PO CAPS: 50000.0000 [IU] | ORAL_CAPSULE | ORAL | 0 refills | Status: AC

## 2020-08-19 MED ORDER — TAMOXIFEN CITRATE 10 MG PO TABS: 10.0000 mg | ORAL_TABLET | Freq: Every day | ORAL | 4 refills | Status: DC

## 2020-08-19 MED ORDER — HYDROCORTISONE 2.5 % EX CREA: 1.0000 "application " | TOPICAL_CREAM | CUTANEOUS | 0 refills | Status: DC | PRN

## 2020-08-20 ENCOUNTER — Telehealth: Payer: Self-pay | Admitting: Oncology

## 2020-08-20 NOTE — Telephone Encounter (Signed)
Scheduled appt per 3/28 los. Pt aware.

## 2020-09-27 ENCOUNTER — Other Ambulatory Visit: Payer: Self-pay | Admitting: Oncology
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
## 2021-01-30 ENCOUNTER — Ambulatory Visit: Payer: 59

## 2021-02-28 ENCOUNTER — Other Ambulatory Visit: Payer: Self-pay

## 2021-02-28 ENCOUNTER — Ambulatory Visit
Admission: RE | Admit: 2021-02-28 | Discharge: 2021-02-28 | Disposition: A | Discharge status: Home / Self Care | Payer: 59 | Source: Ambulatory Visit | Attending: Oncology | Admitting: Oncology

## 2021-02-28 DIAGNOSIS — Z1239 Encounter for other screening for malignant neoplasm of breast: Secondary | ICD-10-CM

## 2021-02-28 DIAGNOSIS — Z9189 Other specified personal risk factors, not elsewhere classified: Secondary | ICD-10-CM

## 2021-07-11 ENCOUNTER — Other Ambulatory Visit: Payer: Self-pay | Admitting: Oncology

## 2021-07-11 ENCOUNTER — Other Ambulatory Visit: Payer: Self-pay | Admitting: Hematology and Oncology

## 2021-07-11 DIAGNOSIS — Z9189 Other specified personal risk factors, not elsewhere classified: Secondary | ICD-10-CM

## 2021-07-11 DIAGNOSIS — Z1239 Encounter for other screening for malignant neoplasm of breast: Secondary | ICD-10-CM

## 2021-08-12 ENCOUNTER — Other Ambulatory Visit: Payer: Self-pay

## 2021-08-12 ENCOUNTER — Ambulatory Visit
Admission: RE | Admit: 2021-08-12 | Discharge: 2021-08-12 | Disposition: A | Discharge status: Home / Self Care | Payer: 59 | Source: Ambulatory Visit | Attending: Hematology and Oncology | Admitting: Hematology and Oncology

## 2021-08-12 DIAGNOSIS — Z9189 Other specified personal risk factors, not elsewhere classified: Secondary | ICD-10-CM

## 2021-08-12 DIAGNOSIS — Z1239 Encounter for other screening for malignant neoplasm of breast: Secondary | ICD-10-CM

## 2021-08-12 MED ORDER — GADOBUTROL 1 MMOL/ML IV SOLN
9.0000 mL | Freq: Once | INTRAVENOUS | Status: AC | PRN
  Administered 2021-08-12: 9 mL via INTRAVENOUS

## 2021-08-25 NOTE — Progress Notes (Signed)
? ? ?Patient Care Team: ?Harrison Mons, PA as PCP - General (Family Medicine) ?Josue Hector, MD as PCP - Cardiology (Cardiology) ?Olga Millers, MD as Consulting Physician (Obstetrics and Gynecology) ?Jerrell Belfast, MD as Consulting Physician (Otolaryngology) ?Josue Hector, MD as Consulting Physician (Cardiology) ?Thornton Park, MD as Consulting Physician (Gastroenterology) ?Magrinat, Virgie Dad, MD (Inactive) as Consulting Physician (Oncology) ?Gaynelle Arabian, MD as Consulting Physician (Orthopedic Surgery) ?Rolm Bookbinder, MD as Consulting Physician (General Surgery) ? ?DIAGNOSIS:  ?Encounter Diagnosis  ?Name Primary?  ? At high risk for breast cancer   ? ?  ? ?CHIEF COMPLIANT:  Breast and ovarian cancer high risk ? ?INTERVAL HISTORY: Amber Rowe is a 55 y.o. with the above mention  Breast and ovarian cancer high risk. She presents to the clinic today for a follow-up. ?She could not tolerate the tamoxifen.  She stopped it because of vaginal irritability. ?She is interested in trying it once again. ? ?ALLERGIES:  is allergic to latex and erythromycin base. ? ?MEDICATIONS:  ?Current Outpatient Medications  ?Medication Sig Dispense Refill  ? Calcium Citrate-Vitamin D 500-12.5 MG-MCG CHEW Chew 2 tablets by mouth daily.    ? omeprazole (PRILOSEC) 40 MG capsule Take 1 capsule (40 mg total) by mouth daily.    ? psyllium (METAMUCIL) 0.52 g capsule Take 2 capsules (1.04 g total) by mouth daily.    ? tamoxifen (NOLVADEX) 10 MG tablet Take 0.5 tablets (5 mg total) by mouth daily. 30 tablet 0  ? acetaminophen (TYLENOL) 500 MG tablet Take 500 mg by mouth. Take 2 pills prn    ? Doxylamine Succinate, Sleep, (UNISOM PO) Take 0.5 tablets by mouth at bedtime.    ? ergocalciferol (VITAMIN D2) 1.25 MG (50000 UT) capsule Take 1 capsule (50,000 Units total) by mouth once a week. 6 capsule 0  ? escitalopram (LEXAPRO) 5 MG tablet Take 2.5 mg by mouth daily.     ? hydrocortisone 2.5 % cream APPLY 1  APPLICATION TOPICALLY AS NEEDED (DRY SKIN). 28 g 1  ? loratadine (CLARITIN) 10 MG tablet Take 10 mg by mouth daily. Take 1/2 pill daily    ? Multiple Vitamin (MULTIVITAMIN) capsule Take 1 capsule by mouth daily.    ? propranolol (INDERAL) 10 MG tablet Take 1 tablet (10 mg total) by mouth as needed (palpitations). 30 tablet 3  ? SUMAtriptan (IMITREX) 50 MG tablet Take 12.5 mg by mouth every 2 (two) hours as needed for migraine or headache. May repeat in 2 hours if headache persists or recurs.     ? ?Current Facility-Administered Medications  ?Medication Dose Route Frequency Provider Last Rate Last Admin  ? 0.9 %  sodium chloride infusion  500 mL Intravenous Once Thornton Park, MD      ? ? ?PHYSICAL EXAMINATION: ?ECOG PERFORMANCE STATUS: 1 - Symptomatic but completely ambulatory ? ?Vitals:  ? 08/27/21 0836  ?BP: 130/70  ?Pulse: 83  ?Resp: 18  ?Temp: (!) 97.5 ?F (36.4 ?C)  ?SpO2: 100%  ? ?Filed Weights  ? 08/27/21 0836  ?Weight: 185 lb 1.6 oz (84 kg)  ? ?  ? ?LABORATORY DATA:  ?I have reviewed the data as listed ? ?  Latest Ref Rng & Units 04/14/2020  ? 12:42 PM 06/29/2017  ?  2:46 PM 11/25/2012  ? 12:14 PM  ?CMP  ?Glucose 70 - 99 mg/dL 110   113   93    ?BUN 6 - 20 mg/dL '13   18   12    ' ?Creatinine 0.44 -  1.00 mg/dL 0.80   0.95   0.80    ?Sodium 135 - 145 mmol/L 142   141   137    ?Potassium 3.5 - 5.1 mmol/L 3.9   4.4   4.0    ?Chloride 98 - 111 mmol/L 108   106   103    ?CO2 22 - 32 mmol/L '24   23   26    ' ?Calcium 8.9 - 10.3 mg/dL 9.4   9.7   9.0    ?Total Protein 6.5 - 8.1 g/dL 6.9    7.3    ?Total Bilirubin 0.3 - 1.2 mg/dL 0.5    0.4    ?Alkaline Phos 38 - 126 U/L 52    80    ?AST 15 - 41 U/L 21    15    ?ALT 0 - 44 U/L 17    12    ? ? ?Lab Results  ?Component Value Date  ? WBC 8.0 04/14/2020  ? HGB 13.2 04/14/2020  ? HCT 42.4 04/14/2020  ? MCV 95.5 04/14/2020  ? PLT 222 04/14/2020  ? NEUTROABS 6.4 04/14/2020  ? ? ?ASSESSMENT & PLAN:  ?At high risk for breast cancer ?High risk for both breast and ovarian  cancer ?Breast cancer risk: 25% lifetime risk.  Tyrer-Cuzick model, 10-year risk: 8.9%, 5-year risk: 3.5% ?Ovarian cancer risk: Mother and maternal grandmother both had ovarian cancers, VUS ATM gene ? ?Risk reduction: Tamoxifen 20 mg daily started December 2020 she took it for a month and discontinued because of severe vaginal irritability and excoriation. ?She is interested in taking it once again. ?Therefore I sent a prescription for 5 mg of tamoxifen to be taken daily. ? ?Breast cancer surveillance: ?1.  mammogram 03/04/2021: Benign breast density category C ?2.  Breast MRI 08/12/2021: Benign breast density category C ? ?1 month telephone visit to discuss tolerance to tamoxifen. ?Return to clinic in 1 year for follow-up ? ? ?Orders Placed This Encounter  ?Procedures  ? MR BREAST BILATERAL W WO CONTRAST INC CAD  ?  Standing Status:   Future  ?  Standing Expiration Date:   08/28/2022  ?  Order Specific Question:   If indicated for the ordered procedure, I authorize the administration of contrast media per Radiology protocol  ?  Answer:   Yes  ?  Order Specific Question:   What is the patient's sedation requirement?  ?  Answer:   No Sedation  ?  Order Specific Question:   Does the patient have a pacemaker or implanted devices?  ?  Answer:   No  ?  Order Specific Question:   Preferred imaging location?  ?  Answer:   [facility] (table limit-550lbs)  ?  Order Specific Question:   Release to patient  ?  Answer:   Immediate  ? ?The patient has a good understanding of the overall plan. she agrees with it. she will call with any problems that may develop before the next visit here. ?Total time spent: 30 mins including face to face time and time spent for planning, charting and co-ordination of care ? ? Harriette Ohara, MD ?08/27/21 ? ? ? I Gardiner Coins am scribing for Dr. Lindi Adie ? ?I have reviewed the above documentation for accuracy and completeness, and I agree with the above. ?  ?

## 2021-08-27 ENCOUNTER — Inpatient Hospital Stay: Payer: 59 | Attending: Hematology and Oncology | Admitting: Hematology and Oncology

## 2021-08-27 ENCOUNTER — Other Ambulatory Visit: Payer: Self-pay

## 2021-08-27 DIAGNOSIS — R922 Inconclusive mammogram: Secondary | ICD-10-CM | POA: Diagnosis present

## 2021-08-27 DIAGNOSIS — Z1502 Genetic susceptibility to malignant neoplasm of ovary: Secondary | ICD-10-CM | POA: Diagnosis present

## 2021-08-27 DIAGNOSIS — Z8041 Family history of malignant neoplasm of ovary: Secondary | ICD-10-CM | POA: Insufficient documentation

## 2021-08-27 DIAGNOSIS — Z1501 Genetic susceptibility to malignant neoplasm of breast: Secondary | ICD-10-CM | POA: Insufficient documentation

## 2021-08-27 DIAGNOSIS — Z9189 Other specified personal risk factors, not elsewhere classified: Secondary | ICD-10-CM | POA: Diagnosis not present

## 2021-08-27 MED ORDER — CALCIUM CITRATE-VITAMIN D 500-12.5 MG-MCG PO CHEW: 2.0000 | CHEWABLE_TABLET | Freq: Every day | ORAL | Status: DC

## 2021-08-27 MED ORDER — OMEPRAZOLE 40 MG PO CPDR: 40.0000 mg | DELAYED_RELEASE_CAPSULE | Freq: Every day | ORAL | Status: AC

## 2021-08-27 MED ORDER — TAMOXIFEN CITRATE 10 MG PO TABS: 5.0000 mg | ORAL_TABLET | Freq: Every day | ORAL | 0 refills | Status: DC

## 2021-08-27 MED ORDER — METAMUCIL 0.52 G PO CAPS: 2.0000 | ORAL_CAPSULE | Freq: Every day | ORAL | Status: DC

## 2021-08-27 NOTE — Assessment & Plan Note (Addendum)
High risk for both breast and ovarian cancer ?Breast cancer risk: 25% lifetime risk.  Tyrer-Cuzick model, 10-year risk: 8.9%, 5-year risk: 3.5% ?Ovarian cancer risk: Mother and maternal grandmother both had ovarian cancers, VUS ATM gene ? ?Risk reduction: Tamoxifen 20 mg daily started December 2020 she took it for a month and discontinued because of severe vaginal irritability and excoriation. ?She is interested in taking it once again. ?Therefore I sent a prescription for 5 mg of tamoxifen to be taken daily. ? ?Breast cancer surveillance: ?1.  Breast exam 08/27/2021: Benign ?2. mammogram 03/04/2021: Benign breast density category C ?3.  Breast MRI 08/12/2021: Benign breast density category C ? ?1 month telephone visit to discuss tolerance to tamoxifen. ?Return to clinic in 1 year for follow-up ?

## 2021-09-12 NOTE — Progress Notes (Signed)
HEMATOLOGY-ONCOLOGY TELEPHONE VISIT PROGRESS NOTE ? ?I connected with Amber Rowe on 09/26/21 at  8:15 AM EDT by telephone and verified that I am speaking with the correct person using two identifiers.  ?I discussed the limitations, risks, security and privacy concerns of performing an evaluation and management service by telephone and the availability of in person appointments.  ?I also discussed with the patient that there may be a patient responsible charge related to this service. The patient expressed understanding and agreed to proceed.  ? ?CHIEF COMPLAINT: Breast and ovarian cancer high risk, follow-up on tamoxifen ?  ? ?History of Present Illness: Amber Rowe is a 56 y.o. with the above mention Breast and ovarian cancer high risk. Currently on Tamoxifen 5 mg daily. She presents to the clinic today for a follow-up.  She reports that although she has been tolerating the half a tablet daily reasonably well she has started noticing excoriation of symptoms in the vaginal area.  They are not severe to stop treatment.  She is requesting topical creams to help with that. ?  ?REVIEW OF SYSTEMS:   ?Constitutional: Denies fevers, chills or abnormal weight loss ? ?All other systems were reviewed with the patient and are negative. ?Observations/Objective:  ? ?  ?Assessment Plan:  ?At high risk for breast cancer ?High risk for both breast and ovarian cancer ?Breast cancer risk: 25% lifetime risk.  Tyrer-Cuzick model, 10-year risk: 8.9%, 5-year risk: 3.5% ?Ovarian cancer risk: Mother and maternal grandmother both had ovarian cancers, VUS ATM gene ?  ?Risk reduction: Tamoxifen 20 mg daily started December 2020 she took it for a month and discontinued because of severe vaginal irritability and excoriation. ?08/27/2021: Restarted tamoxifen 5 mg daily ? ?Tamoxifen toxicities: ?Vaginal dryness/excoriation: I sent a prescription for clotrimazole and betamethasone ointment as well as topical Estrace ointment.  I discussed with her  that very little of the Estrace is generally absorbed and even if it does get absorbed tamoxifen will be there to block the effects of the small amount of estrogen on the breast tissue. ?Patient is inclined to continue the treatment. ?  ?Breast cancer surveillance: ?1.  mammogram 03/04/2021: Benign breast density category C ?2.  Breast MRI 08/12/2021: Benign breast density category C ?  ?Follow-up in 1 year ? ? ? ?I discussed the assessment and treatment plan with the patient. The patient was provided an opportunity to ask questions and all were answered. The patient agreed with the plan and demonstrated an understanding of the instructions. The patient was advised to call back or seek an in-person evaluation if the symptoms worsen or if the condition fails to improve as anticipated.  ? ?I provided 12 minutes of non-face-to-face time during this encounter. Harriette Ohara, MD   ?Earlie Server am scribing for Dr. Lindi Adie ? ?I have reviewed the above documentation for accuracy and completeness, and I agree with the above. ?  ?

## 2021-09-26 ENCOUNTER — Inpatient Hospital Stay: Payer: 59 | Attending: Hematology and Oncology | Admitting: Hematology and Oncology

## 2021-09-26 DIAGNOSIS — Z9189 Other specified personal risk factors, not elsewhere classified: Secondary | ICD-10-CM

## 2021-09-26 MED ORDER — ESTRADIOL 0.1 MG/GM VA CREA: 1.0000 | TOPICAL_CREAM | Freq: Every day | VAGINAL | 12 refills | Status: DC

## 2021-09-26 MED ORDER — CLOTRIMAZOLE-BETAMETHASONE 1-0.05 % EX CREA: 1.0000 "application " | TOPICAL_CREAM | Freq: Two times a day (BID) | CUTANEOUS | 0 refills | Status: AC

## 2021-09-26 MED ORDER — TAMOXIFEN CITRATE 10 MG PO TABS
5.0000 mg | ORAL_TABLET | Freq: Every day | ORAL | 3 refills | Status: DC
Start: 2021-09-26 — End: 2022-08-31

## 2021-09-26 NOTE — Assessment & Plan Note (Signed)
High risk for both breast and ovarian cancer ?Breast cancer risk: 25% lifetime risk.  Tyrer-Cuzick model, 10-year risk: 8.9%, 5-year risk: 3.5% ?Ovarian cancer risk: Mother and maternal grandmother both had ovarian cancers, VUS ATM gene ?? ?Risk reduction: Tamoxifen 20 mg daily started December 2020 she took it for a month and discontinued because of severe vaginal irritability and excoriation. ?08/27/2021: Restarted tamoxifen 5 mg daily ? ?Tamoxifen toxicities: ?? ?Breast cancer surveillance: ?1.  mammogram 03/04/2021: Benign breast density category C ?2.  Breast MRI 08/12/2021: Benign breast density category C ?? ?Follow-up in 1 year ?

## 2022-08-06 ENCOUNTER — Ambulatory Visit
Admission: RE | Admit: 2022-08-06 | Discharge: 2022-08-06 | Disposition: A | Discharge status: Home / Self Care | Payer: 59 | Source: Ambulatory Visit | Attending: Hematology and Oncology | Admitting: Hematology and Oncology

## 2022-08-06 DIAGNOSIS — Z9189 Other specified personal risk factors, not elsewhere classified: Secondary | ICD-10-CM

## 2022-08-06 MED ORDER — GADOPICLENOL 0.5 MMOL/ML IV SOLN
9.0000 mL | Freq: Once | INTRAVENOUS | Status: AC | PRN
  Administered 2022-08-06: 9 mL via INTRAVENOUS

## 2022-08-07 ENCOUNTER — Telehealth: Payer: Self-pay | Admitting: *Deleted

## 2022-08-07 NOTE — Telephone Encounter (Signed)
-----   Message from Gardenia Phlegm, NP sent at 08/07/2022 12:48 PM EDT ----- Please let patient know the good news that her MRI shows no signs of breast cancer or abnormality. ----- Message ----- From: Interface, Rad Results In Sent: 08/07/2022   8:12 AM EDT To: Nicholas Lose, MD

## 2022-08-07 NOTE — Telephone Encounter (Signed)
RN placed call to pt and provided below information from NP.  Pt appreciative of the call and verbalized understanding.

## 2022-08-28 NOTE — Progress Notes (Signed)
Patient Care Team: Amber Rowe, Amber, Rowe as PCP - General (Family Medicine) Amber Rowe, Amber C, MD as PCP - Cardiology (Cardiology) Amber Rowe, Amber E, MD as Consulting Physician (Obstetrics and Gynecology) Amber Rowe, David, MD as Consulting Physician (Otolaryngology) Amber Rowe, Amber C, MD as Consulting Physician (Cardiology) Amber Rowe, Kimberly, MD as Consulting Physician (Gastroenterology) Amber Rowe, Amber HueGustav C, MD (Inactive) as Consulting Physician (Oncology) Amber Rowe, Frank, MD as Consulting Physician (Orthopedic Surgery) Amber Rowe, Matthew, MD as Consulting Physician (General Surgery)  DIAGNOSIS: No diagnosis found.  SUMMARY OF ONCOLOGIC HISTORY: Oncology History   No history exists.    CHIEF COMPLIANT: Follow-up Breast and ovarian cancer high risk.  INTERVAL HISTORY: Amber PrimaGretchen Rowe Obst is a 57 y.o. 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 Currently on Tamoxifen 5 mg daily. She presents to Amber clinic today for a follow-up.    ALLERGIES:  is allergic to latex and erythromycin base.  MEDICATIONS:  Current Outpatient Medications  Medication Sig Dispense Refill   acetaminophen (TYLENOL) 500 MG tablet Take 500 mg by mouth. Take 2 pills prn     Calcium Citrate-Vitamin D 500-12.5 MG-MCG CHEW Chew 2 tablets by mouth daily.     clotrimazole-betamethasone (LOTRISONE) cream Apply 1 application. topically 2 (two) times daily. 30 g 0   Doxylamine Succinate, Sleep, (UNISOM PO) Take 0.5 tablets by mouth at bedtime.     ergocalciferol (VITAMIN D2) 1.25 MG (50000 UT) capsule Take 1 capsule (50,000 Units total) by mouth once a week. 6 capsule 0   escitalopram (LEXAPRO) 5 MG tablet Take 2.5 mg by mouth daily.      estradiol (ESTRACE VAGINAL) 0.1 MG/GM vaginal cream Place 1 Applicatorful vaginally at bedtime. 42.5 g 12   hydrocortisone 2.5 % cream APPLY 1 APPLICATION TOPICALLY AS NEEDED (DRY SKIN). 28 g 1   loratadine (CLARITIN) 10 MG tablet Take 10 mg by mouth daily. Take 1/2 pill  daily     Multiple Vitamin (MULTIVITAMIN) capsule Take 1 capsule by mouth daily.     omeprazole (PRILOSEC) 40 MG capsule Take 1 capsule (40 mg total) by mouth daily.     propranolol (INDERAL) 10 MG tablet Take 1 tablet (10 mg total) by mouth as needed (palpitations). 30 tablet 3   psyllium (METAMUCIL) 0.52 g capsule Take 2 capsules (1.04 g total) by mouth daily.     SUMAtriptan (IMITREX) 50 MG tablet Take 12.5 mg by mouth every 2 (two) hours as needed for migraine or headache. May repeat in 2 hours if headache persists or recurs.      tamoxifen (NOLVADEX) 10 MG tablet Take 0.5 tablets (5 mg total) by mouth daily. 60 tablet 3   Current Facility-Administered Medications  Medication Dose Route Frequency Provider Last Rate Last Admin   0.9 %  sodium chloride infusion  500 mL Intravenous Once Amber Rowe, Kimberly, MD        PHYSICAL EXAMINATION: ECOG PERFORMANCE STATUS: {CHL ONC ECOG [id number]}[phone]}  There were no vitals filed for this visit. There were no vitals filed for this visit.  BREAST:*** No palpable masses or nodules in either right or left breasts. No palpable axillary supraclavicular or infraclavicular adenopathy no breast tenderness or nipple discharge. (exam performed in Amber presence of a chaperone)  LABORATORY DATA:  I have reviewed Amber data as listed    Latest Ref Rng & Units 04/14/2020   12:42 PM 06/29/2017    2:46 PM 11/25/2012   12:14 PM  CMP  Glucose 70 - 99 mg/dL 846110  962113  93  BUN 6 - 20 mg/dL 13  18  12    Creatinine 0.44 - 1.00 mg/dL 5.27  7.82  4.23   Sodium 135 - 145 mmol/L 142  141  137   Potassium 3.5 - 5.1 mmol/L 3.9  4.4  4.0   Chloride 98 - 111 mmol/L 108  106  103   CO2 22 - 32 mmol/L 24  23  26    Calcium 8.9 - 10.3 mg/dL 9.4  9.7  9.0   Total Protein 6.5 - 8.1 g/dL 6.9   7.3   Total Bilirubin 0.3 - 1.2 mg/dL 0.5   0.4   Alkaline Phos 38 - 126 U/L 52   80   AST 15 - 41 U/L 21   15   ALT 0 - 44 U/L 17   12     Lab Results  Component Value Date   WBC 8.0  04/14/2020   HGB 13.2 04/14/2020   HCT 42.4 04/14/2020   MCV 95.5 04/14/2020   PLT 222 04/14/2020   NEUTROABS 6.4 04/14/2020    ASSESSMENT & PLAN:  No problem-specific Assessment & Plan notes found for this encounter.    No orders of Amber defined types were placed in this encounter.  Amber patient has a good understanding of Amber overall plan. she agrees with it. she will call with any problems that may develop before Amber next visit here. Total time spent: 30 mins including face to face time and time spent for planning, charting and co-ordination of care   Amber Rowe, Amber Rowe 08/28/22    I Amber Rowe am acting as a Neurosurgeon for Amber Rowe  ***

## 2022-08-31 ENCOUNTER — Other Ambulatory Visit: Payer: Self-pay

## 2022-08-31 ENCOUNTER — Inpatient Hospital Stay: Payer: 59 | Attending: Hematology and Oncology | Admitting: Hematology and Oncology

## 2022-08-31 VITALS — BP 151/72 | HR 95 | Temp 97.2°F | Resp 18 | Ht 67.0 in | Wt 182.0 lb

## 2022-08-31 DIAGNOSIS — Z9189 Other specified personal risk factors, not elsewhere classified: Secondary | ICD-10-CM | POA: Diagnosis not present

## 2022-08-31 DIAGNOSIS — Z803 Family history of malignant neoplasm of breast: Secondary | ICD-10-CM | POA: Diagnosis present

## 2022-08-31 DIAGNOSIS — Z79810 Long term (current) use of selective estrogen receptor modulators (SERMs): Secondary | ICD-10-CM | POA: Diagnosis not present

## 2022-08-31 DIAGNOSIS — Z1501 Genetic susceptibility to malignant neoplasm of breast: Secondary | ICD-10-CM | POA: Diagnosis present

## 2022-08-31 DIAGNOSIS — Z1502 Genetic susceptibility to malignant neoplasm of ovary: Secondary | ICD-10-CM | POA: Insufficient documentation

## 2022-08-31 DIAGNOSIS — R232 Flushing: Secondary | ICD-10-CM | POA: Insufficient documentation

## 2022-08-31 MED ORDER — TAMOXIFEN CITRATE 10 MG PO TABS: 5.0000 mg | ORAL_TABLET | Freq: Every day | ORAL | 1 refills | Status: AC

## 2022-08-31 NOTE — Assessment & Plan Note (Signed)
High risk for both breast and ovarian cancer Breast cancer risk: 25% lifetime risk.  Tyrer-Cuzick model, 10-year risk: 8.9%, 5-year risk: 3.5% Ovarian cancer risk: Mother and maternal grandmother both had ovarian cancers, VUS ATM gene   Risk reduction: Tamoxifen 20 mg daily started December 2020 she took it for a month and discontinued because of severe vaginal irritability and excoriation. 08/27/2021: Restarted tamoxifen 5 mg daily   Tamoxifen toxicities: Vaginal dryness/excoriation:  Breast cancer surveillance: 1.  mammogram 03/04/2021: Benign breast density category C: Due for mammogram 2.  Breast MRI 08/07/2022: Benign breast density category C   Follow-up in 1 year

## 2023-01-20 ENCOUNTER — Other Ambulatory Visit: Payer: Self-pay | Admitting: Obstetrics

## 2023-01-20 DIAGNOSIS — R928 Other abnormal and inconclusive findings on diagnostic imaging of breast: Secondary | ICD-10-CM

## 2023-02-02 ENCOUNTER — Ambulatory Visit
Admission: RE | Admit: 2023-02-02 | Discharge: 2023-02-02 | Disposition: A | Discharge status: Home / Self Care | Payer: 59 | Source: Ambulatory Visit | Attending: Obstetrics

## 2023-02-02 DIAGNOSIS — R928 Other abnormal and inconclusive findings on diagnostic imaging of breast: Secondary | ICD-10-CM

## 2023-08-10 ENCOUNTER — Other Ambulatory Visit: Payer: 59

## 2023-08-30 ENCOUNTER — Telehealth: Payer: Self-pay | Admitting: *Deleted

## 2023-08-30 ENCOUNTER — Ambulatory Visit
Admission: RE | Admit: 2023-08-30 | Discharge: 2023-08-30 | Disposition: A | Discharge status: Home / Self Care | Source: Ambulatory Visit | Attending: Hematology and Oncology | Admitting: Hematology and Oncology

## 2023-08-30 DIAGNOSIS — Z9189 Other specified personal risk factors, not elsewhere classified: Secondary | ICD-10-CM

## 2023-08-30 MED ORDER — GADOPICLENOL 0.5 MMOL/ML IV SOLN
8.0000 mL | Freq: Once | INTRAVENOUS | Status: AC | PRN
  Administered 2023-08-30: 8 mL via INTRAVENOUS

## 2023-08-30 NOTE — Telephone Encounter (Signed)
-----   Message from Noreene Filbert sent at 08/30/2023  2:40 PM EDT ----- Please call patient with good news results.  MR negative for malignancy, recommend continue annual mammogram and screening breast MRI ----- Message ----- From: Interface, Rad Results In Sent: 08/30/2023  11:20 AM EDT To: Serena Croissant, MD

## 2023-08-30 NOTE — Telephone Encounter (Signed)
 Per Lillard Anes, NP, called pt with message below. Pt verbalized understanding. Also, pt stated her surgeon suggested she no longer needs MRI's but contrast mammograms. She stated she will speak with Dr. Pamelia Hoit at next appt tomorrow.

## 2023-08-31 ENCOUNTER — Inpatient Hospital Stay: Payer: 59 | Attending: Hematology and Oncology | Admitting: Hematology and Oncology

## 2023-08-31 VITALS — BP 128/70 | HR 18 | Temp 98.0°F | Resp 18 | Wt 186.3 lb

## 2023-08-31 DIAGNOSIS — Z1502 Genetic susceptibility to malignant neoplasm of ovary: Secondary | ICD-10-CM | POA: Insufficient documentation

## 2023-08-31 DIAGNOSIS — Z79899 Other long term (current) drug therapy: Secondary | ICD-10-CM | POA: Diagnosis not present

## 2023-08-31 DIAGNOSIS — Z79810 Long term (current) use of selective estrogen receptor modulators (SERMs): Secondary | ICD-10-CM | POA: Insufficient documentation

## 2023-08-31 DIAGNOSIS — Z9189 Other specified personal risk factors, not elsewhere classified: Secondary | ICD-10-CM | POA: Diagnosis not present

## 2023-08-31 DIAGNOSIS — Z803 Family history of malignant neoplasm of breast: Secondary | ICD-10-CM | POA: Insufficient documentation

## 2023-08-31 DIAGNOSIS — N951 Menopausal and female climacteric states: Secondary | ICD-10-CM | POA: Insufficient documentation

## 2023-08-31 DIAGNOSIS — Z1501 Genetic susceptibility to malignant neoplasm of breast: Secondary | ICD-10-CM | POA: Insufficient documentation

## 2023-08-31 DIAGNOSIS — Z8041 Family history of malignant neoplasm of ovary: Secondary | ICD-10-CM | POA: Insufficient documentation

## 2023-08-31 DIAGNOSIS — R232 Flushing: Secondary | ICD-10-CM | POA: Diagnosis not present

## 2023-08-31 NOTE — Assessment & Plan Note (Signed)
 High risk for both breast and ovarian cancer Breast cancer risk: 25% lifetime risk.  Tyrer-Cuzick model, 10-year risk: 8.9%, 5-year risk: 3.5% Ovarian cancer risk: Mother and maternal grandmother both had ovarian cancers, VUS ATM gene   Risk reduction: Tamoxifen 20 mg daily started December 2020 she took it for a month and discontinued because of severe vaginal irritability and excoriation. 08/27/2021: Restarted tamoxifen 5 mg daily   Tamoxifen toxicities: Vaginal dryness/excoriation: Resolved when she reduced the dosage of tamoxifen Hot flashes: Patient is using Effexor and she is up to 75 mg.  She is going to see her primary care nurse practitioner who might be increasing the dosage.  I discussed with her about Allyne Gee as an alternate option.   Breast cancer surveillance: 1.  mammogram 02/02/2023: Benign breast density category C  2.  Breast MRI 08/30/2023: Benign breast density category B   Follow-up in 1 year

## 2023-08-31 NOTE — Progress Notes (Signed)
 Patient Care Team: Porfirio Oar, [place] as PCP - General (Family Medicine) Wendall Stade, MD as PCP - Cardiology (Cardiology) Levi Aland, MD as Consulting Physician (Obstetrics and Gynecology) Osborn Coho, MD (Inactive) as Consulting Physician (Otolaryngology) Wendall Stade, MD as Consulting Physician (Cardiology) Tressia Danas, MD (Inactive) as Consulting Physician (Gastroenterology) Magrinat, Valentino Hue, MD (Inactive) as Consulting Physician (Oncology) Ollen Gross, MD as Consulting Physician (Orthopedic Surgery) Emelia Loron, MD as Consulting Physician (General Surgery)  DIAGNOSIS:  Encounter Diagnosis  Name Primary?   At high risk for breast cancer Yes     CHIEF COMPLIANT: Follow-up on tamoxifen surveillance of breast cancer  HISTORY OF PRESENT ILLNESS:   History of Present Illness The patient, who has a history of being high risk for breast cancer, has been on tamoxifen since April 23. She initially experienced significant vaginal dryness and a raw feeling, which she describes as being excoriated. However, these symptoms have improved since the dosage was reduced to 5mg . The patient also reports a new symptom of vaginal discharge, which she is not used to having since she had a hysterectomy in the past. She is tolerating the tamoxifen well and is committed to continuing the medication despite these side effects.  The patient recently had imaging scans, the results of which were discussed during this appointment. The patient has been anxious about these scans and is relieved to hear that the results are clear.     ALLERGIES:  is allergic to latex.  MEDICATIONS:  Current Outpatient Medications  Medication Sig Dispense Refill   acetaminophen (TYLENOL) 500 MG tablet Take 500 mg by mouth. Take 2 pills prn     clotrimazole-betamethasone (LOTRISONE) cream Apply 1 application. topically 2 (two) times daily. 30 g 0   Doxylamine Succinate, Sleep, (UNISOM PO)  Take 0.5 tablets by mouth at bedtime.     ergocalciferol (VITAMIN D2) 1.25 MG (50000 UT) capsule Take 1 capsule (50,000 Units total) by mouth once a week. 6 capsule 0   escitalopram (LEXAPRO) 10 MG tablet Take 10 mg by mouth daily.     hydrocortisone 2.5 % cream APPLY 1 APPLICATION TOPICALLY AS NEEDED (DRY SKIN). 28 g 1   Multiple Vitamin (MULTIVITAMIN) capsule Take by mouth.     omeprazole (PRILOSEC) 40 MG capsule Take 1 capsule (40 mg total) by mouth daily.     propranolol (INDERAL) 10 MG tablet Take 1 tablet (10 mg total) by mouth as needed (palpitations). 30 tablet 3   tamoxifen (NOLVADEX) 10 MG tablet Take 0.5 tablets (5 mg total) by mouth daily. 90 tablet 1   VEOZAH 45 MG TABS Take 1 tablet by mouth daily.     Calcium Citrate 150 MG CAPS calcium citrate (Patient not taking: Reported on 08/31/2023)     SUMAtriptan (IMITREX) 50 MG tablet Take 12.5 mg by mouth every 2 (two) hours as needed for migraine or headache. May repeat in 2 hours if headache persists or recurs.  (Patient not taking: Reported on 08/31/2023)     Current Facility-Administered Medications  Medication Dose Route Frequency Provider Last Rate Last Admin   0.9 %  sodium chloride infusion  500 mL Intravenous Once Tressia Danas, MD        PHYSICAL EXAMINATION: ECOG PERFORMANCE STATUS: 1 - Symptomatic but completely ambulatory  Vitals:   08/31/23 0845  BP: 128/70  Pulse: (!) 18  Resp: 18  Temp: 98 F (36.7 C)  SpO2: 98%   Filed Weights   08/31/23 0845  Weight: 186  lb 4.8 oz (84.5 kg)    LABORATORY DATA:  I have reviewed the data as listed    Latest Ref Rng & Units 04/14/2020   12:42 PM 06/29/2017    2:46 PM 11/25/2012   12:14 PM  CMP  Glucose 70 - 99 mg/dL 161  096  93   BUN 6 - 20 mg/dL 13  18  12    Creatinine 0.44 - 1.00 mg/dL 0.45  4.09  8.11   Sodium 135 - 145 mmol/L 142  141  137   Potassium 3.5 - 5.1 mmol/L 3.9  4.4  4.0   Chloride 98 - 111 mmol/L 108  106  103   CO2 22 - 32 mmol/L 24  23  26     Calcium 8.9 - 10.3 mg/dL 9.4  9.7  9.0   Total Protein 6.5 - 8.1 g/dL 6.9   7.3   Total Bilirubin 0.3 - 1.2 mg/dL 0.5   0.4   Alkaline Phos 38 - 126 U/L 52   80   AST 15 - 41 U/L 21   15   ALT 0 - 44 U/L 17   12     Lab Results  Component Value Date   WBC 8.0 04/14/2020   HGB 13.2 04/14/2020   HCT 42.4 04/14/2020   MCV 95.5 04/14/2020   PLT 222 04/14/2020   NEUTROABS 6.4 04/14/2020    ASSESSMENT & PLAN:  At high risk for breast cancer High risk for both breast and ovarian cancer Breast cancer risk: 25% lifetime risk.  Tyrer-Cuzick model, 10-year risk: 8.9%, 5-year risk: 3.5% Ovarian cancer risk: Mother and maternal grandmother both had ovarian cancers, VUS ATM gene   Risk reduction: Tamoxifen 20 mg daily started December 2020 she took it for a month and discontinued because of severe vaginal irritability and excoriation. 08/27/2021: Restarted tamoxifen 5 mg daily   Tamoxifen toxicities: Vaginal dryness/excoriation: Resolved when she reduced the dosage of tamoxifen Hot flashes: Patient is using Effexor and she is up to 75 mg.      Breast cancer surveillance: 1.  mammogram 02/02/2023: Benign breast density category C  2.  Breast MRI 08/30/2023: Benign breast density category B I recommended contrast-enhanced mammograms instead of MRIs from September 2025 onwards Follow-up in 1 year   Orders Placed This Encounter  Procedures   MM 2D DIAG BILAT WITH CONTRAST BCG ONLY    Standing Status:   Future    Expected Date:   02/03/2024    Expiration Date:   08/30/2024    Reason for Exam (SYMPTOM  OR DIAGNOSIS REQUIRED):   High risk breast cancer evaluation    If indicated for the ordered procedure, I authorize the administration of contrast media per Radiology protocol:   Yes    Does the patient have a contrast media/X-ray dye allergy?:   No    Is the patient pregnant?:   No    Preferred [facility]    Release to patient:   Immediate   The patient has a good  understanding of the overall plan. she agrees with it. she will call with any problems that may develop before the next visit here. Total time spent: 30 mins including face to face time and time spent for planning, charting and co-ordination of care   Tamsen Meek, MD 08/31/23

## 2023-12-22 ENCOUNTER — Telehealth: Payer: Self-pay | Admitting: *Deleted

## 2023-12-22 NOTE — Telephone Encounter (Signed)
 Received VM from pt requesting receipt of payment for 08/31/23 visit with MD.  RN attempt x1 to return call.  No answer, LVM for pt to contact billing at [phone] for confirmation of payment and receipt.

## 2024-02-25 ENCOUNTER — Other Ambulatory Visit: Payer: Self-pay | Admitting: General Surgery

## 2024-02-25 DIAGNOSIS — Z803 Family history of malignant neoplasm of breast: Secondary | ICD-10-CM

## 2024-04-12 ENCOUNTER — Inpatient Hospital Stay
Admission: RE | Admit: 2024-04-12 | Discharge: 2024-04-12 | Disposition: A | Source: Ambulatory Visit | Attending: General Surgery | Admitting: General Surgery

## 2024-04-12 DIAGNOSIS — Z803 Family history of malignant neoplasm of breast: Secondary | ICD-10-CM

## 2024-04-12 MED ORDER — IOPAMIDOL (ISOVUE-370) INJECTION 76%
100.0000 mL | Freq: Once | INTRAVENOUS | Status: AC | PRN
  Administered 2024-04-12: 100 mL via INTRAVENOUS

## 2024-08-31 ENCOUNTER — Ambulatory Visit: Admitting: Hematology and Oncology
